# Patient Record
Sex: Female | Born: 1935 | Race: Black or African American | Hispanic: No | Marital: Single | Smoking: Never smoker
Health system: Southern US, Community
[De-identification: ages and names within clinical notes are randomized; demographics above are authoritative.]

## PROBLEM LIST (undated history)

## (undated) DIAGNOSIS — I10 Essential (primary) hypertension: Secondary | ICD-10-CM

## (undated) DIAGNOSIS — I639 Cerebral infarction, unspecified: Secondary | ICD-10-CM

## (undated) DIAGNOSIS — E785 Hyperlipidemia, unspecified: Secondary | ICD-10-CM

## (undated) DIAGNOSIS — G9341 Metabolic encephalopathy: Secondary | ICD-10-CM

## (undated) DIAGNOSIS — F0390 Unspecified dementia without behavioral disturbance: Secondary | ICD-10-CM

## (undated) DIAGNOSIS — E876 Hypokalemia: Secondary | ICD-10-CM

## (undated) HISTORY — PX: OTHER SURGICAL HISTORY: SHX169

---

## 2012-10-23 ENCOUNTER — Emergency Department (HOSPITAL_BASED_OUTPATIENT_CLINIC_OR_DEPARTMENT_OTHER): Payer: Medicare Other

## 2012-10-23 ENCOUNTER — Emergency Department (HOSPITAL_BASED_OUTPATIENT_CLINIC_OR_DEPARTMENT_OTHER)
Admission: EM | Admit: 2012-10-23 | Discharge: 2012-10-23 | Disposition: A | Discharge status: Other Acute Inpatient Hospital | Payer: Medicare Other | Attending: Emergency Medicine | Admitting: Emergency Medicine

## 2012-10-23 ENCOUNTER — Other Ambulatory Visit: Payer: Self-pay

## 2012-10-23 ENCOUNTER — Encounter (HOSPITAL_BASED_OUTPATIENT_CLINIC_OR_DEPARTMENT_OTHER): Payer: Self-pay | Admitting: *Deleted

## 2012-10-23 DIAGNOSIS — I10 Essential (primary) hypertension: Secondary | ICD-10-CM | POA: Insufficient documentation

## 2012-10-23 DIAGNOSIS — Z8673 Personal history of transient ischemic attack (TIA), and cerebral infarction without residual deficits: Secondary | ICD-10-CM | POA: Insufficient documentation

## 2012-10-23 DIAGNOSIS — Z79899 Other long term (current) drug therapy: Secondary | ICD-10-CM | POA: Insufficient documentation

## 2012-10-23 DIAGNOSIS — Z8639 Personal history of other endocrine, nutritional and metabolic disease: Secondary | ICD-10-CM | POA: Insufficient documentation

## 2012-10-23 DIAGNOSIS — R110 Nausea: Secondary | ICD-10-CM | POA: Insufficient documentation

## 2012-10-23 DIAGNOSIS — Z862 Personal history of diseases of the blood and blood-forming organs and certain disorders involving the immune mechanism: Secondary | ICD-10-CM | POA: Insufficient documentation

## 2012-10-23 DIAGNOSIS — Z7982 Long term (current) use of aspirin: Secondary | ICD-10-CM | POA: Insufficient documentation

## 2012-10-23 DIAGNOSIS — F0390 Unspecified dementia without behavioral disturbance: Secondary | ICD-10-CM | POA: Insufficient documentation

## 2012-10-23 DIAGNOSIS — E785 Hyperlipidemia, unspecified: Secondary | ICD-10-CM | POA: Insufficient documentation

## 2012-10-23 DIAGNOSIS — R5383 Other fatigue: Secondary | ICD-10-CM | POA: Insufficient documentation

## 2012-10-23 DIAGNOSIS — Z7902 Long term (current) use of antithrombotics/antiplatelets: Secondary | ICD-10-CM | POA: Insufficient documentation

## 2012-10-23 DIAGNOSIS — K819 Cholecystitis, unspecified: Secondary | ICD-10-CM

## 2012-10-23 DIAGNOSIS — R5381 Other malaise: Secondary | ICD-10-CM | POA: Insufficient documentation

## 2012-10-23 HISTORY — DX: Cerebral infarction, unspecified: I63.9

## 2012-10-23 HISTORY — DX: Hyperlipidemia, unspecified: E78.5

## 2012-10-23 HISTORY — DX: Hypokalemia: E87.6

## 2012-10-23 HISTORY — DX: Metabolic encephalopathy: G93.41

## 2012-10-23 HISTORY — DX: Unspecified dementia, unspecified severity, without behavioral disturbance, psychotic disturbance, mood disturbance, and anxiety: F03.90

## 2012-10-23 HISTORY — DX: Essential (primary) hypertension: I10

## 2012-10-23 LAB — URINALYSIS, ROUTINE W REFLEX MICROSCOPIC
Bilirubin Urine: NEGATIVE
Glucose, UA: NEGATIVE mg/dL
Hgb urine dipstick: NEGATIVE
Ketones, ur: NEGATIVE mg/dL
Nitrite: NEGATIVE
Protein, ur: NEGATIVE mg/dL
Specific Gravity, Urine: 1.019 (ref 1.005–1.030)
Urobilinogen, UA: 1 mg/dL (ref 0.0–1.0)
pH: 6 (ref 5.0–8.0)

## 2012-10-23 LAB — CBC WITH DIFFERENTIAL/PLATELET
Basophils Absolute: 0 10*3/uL (ref 0.0–0.1)
Basophils Relative: 0 % (ref 0–1)
HCT: 38.4 % (ref 36.0–46.0)
Hemoglobin: 12.9 g/dL (ref 12.0–15.0)
Lymphocytes Relative: 36 % (ref 12–46)
MCHC: 33.6 g/dL (ref 30.0–36.0)
Monocytes Relative: 10 % (ref 3–12)
Neutro Abs: 1.9 10*3/uL (ref 1.7–7.7)
Neutrophils Relative %: 52 % (ref 43–77)
WBC: 3.6 10*3/uL — ABNORMAL LOW (ref 4.0–10.5)

## 2012-10-23 LAB — HEPATIC FUNCTION PANEL
AST: 17 U/L (ref 0–37)
Albumin: 3.6 g/dL (ref 3.5–5.2)
Total Protein: 7.4 g/dL (ref 6.0–8.3)

## 2012-10-23 LAB — BASIC METABOLIC PANEL WITH GFR
BUN: 10 mg/dL (ref 6–23)
CO2: 24 meq/L (ref 19–32)
Calcium: 9.5 mg/dL (ref 8.4–10.5)
Chloride: 106 meq/L (ref 96–112)
Creatinine, Ser: 0.6 mg/dL (ref 0.50–1.10)
GFR calc Af Amer: >90 mL/min
GFR calc non Af Amer: 81 mL/min — ABNORMAL LOW
Glucose, Bld: 98 mg/dL (ref 70–99)
Potassium: 3.6 meq/L (ref 3.5–5.1)
Sodium: 141 meq/L (ref 135–145)

## 2012-10-23 LAB — LIPASE, BLOOD: Lipase: 36 U/L (ref 11–59)

## 2012-10-23 LAB — URINE MICROSCOPIC-ADD ON

## 2012-10-23 LAB — OCCULT BLOOD X 1 CARD TO LAB, STOOL: Fecal Occult Bld: NEGATIVE

## 2012-10-23 MED ORDER — IOHEXOL 300 MG/ML  SOLN
100.0000 mL | Freq: Once | INTRAMUSCULAR | Status: AC | PRN
  Administered 2012-10-23: 100 mL via INTRAVENOUS

## 2012-10-23 MED ORDER — MORPHINE SULFATE 2 MG/ML IJ SOLN
2.0000 mg | Freq: Once | INTRAMUSCULAR | Status: AC
  Administered 2012-10-23: 2 mg via INTRAVENOUS
  Filled 2012-10-23: qty 1

## 2012-10-23 MED ORDER — MORPHINE SULFATE 4 MG/ML IJ SOLN: 4.0000 mg | Freq: Once | INTRAMUSCULAR | Status: DC

## 2012-10-23 MED ORDER — PIPERACILLIN-TAZOBACTAM 3.375 G IVPB
3.3750 g | Freq: Once | INTRAVENOUS | Status: AC
  Administered 2012-10-23: 3.375 g via INTRAVENOUS
  Filled 2012-10-23: qty 50

## 2012-10-23 NOTE — ED Notes (Signed)
Assigned to bed 658 @ [facility] per nursing supervisor, Boneta Lucks, RN notified, [facility] called for transport.

## 2012-10-23 NOTE — ED Notes (Signed)
Pt put onto 2L of oxygen. Pt 02 were reading 91. 2L 02 applied and 02 sats came up to 97%

## 2012-10-23 NOTE — ED Notes (Signed)
Report called to [facility]. Report given to [facility]

## 2012-10-23 NOTE — ED Notes (Signed)
Pt finished with her liquid contrast drink at this time.

## 2012-10-23 NOTE — ED Notes (Signed)
I spoke with Sharon,nursing supervisor @ [facility] concerning a bed assignment for pt, I was told that I would receive a call back with bed assignment, Misty, RN notified.

## 2012-10-23 NOTE — ED Provider Notes (Signed)
79 year old female who presents to the assisted living with complaint of abdominal pain that began yesterday and worsened this a.m. She was seen and evaluated by Dr. Rennis Chris. She had a CT that raised question of an amount of the gallbladder an ultrasound that raises question of cholecystitis. She has abdominal pain but was able to eat breakfast. She has not had any by mouth since that time. She has not had any vomiting  US Abdomen Complete  10/23/2012  *RADIOLOGY REPORT*  Clinical Data:  Abdominal pain probable sludge or stones on prior CT.  ABDOMINAL ULTRASOUND COMPLETE  Comparison:  CT abdomen 10/23/2012  Findings:  Gallbladder:  Dependent echogenic tiny stones or sludge noted with the gallbladder wall thickening measuring 5 mm.  No sonographic Murphy's sign elicited.  No pericholecystic fluid.  Common Bile Duct:  Within normal limits in caliber.  Liver: Normal where visualized.  Not optimally visualized.  IVC:  Obscured by bowel gas.  Pancreas:  Head appears normal.  Body and tail not well visualized due to bowel gas.  Spleen:  Within normal limits in size and echotexture.  Right kidney:  Normal in size and parenchymal echogenicity.  No evidence of mass or hydronephrosis.  Left kidney:  9.8 cm. No hydronephrosis or focal mass.Not well visualized due to patient body habitus.  Abdominal Aorta:  Proximal and mid aorta are normal.  Distal aorta not well visualized.  IMPRESSION: Tiny gallstones versus sludge with gallbladder wall thickening, which may indicate acute cholecystitis.  Suboptimal visualization of multiple organs, better seen on prior dissimilar exam earlier today, due to patient body habitus and inability to take a deep breath.   Original Report Authenticated By: Christiana Pellant, M.D.    Ct Abdomen Pelvis W Contrast  10/23/2012  *RADIOLOGY REPORT*  Clinical Data: Abdominal pain of 1 day duration.  Previous hysterectomy.  CT ABDOMEN AND PELVIS WITH CONTRAST  Technique:  Multidetector CT imaging of  the abdomen and pelvis was performed following the standard protocol during bolus administration of intravenous contrast.  Contrast: OMNIPAQUE IOHEXOL 300 MG/ML  SOLN  Comparison: None.  Findings: There is mild scarring at the lung bases.  No pleural or pericardial fluid.  The patient has coronary artery calcification.  The liver has a normal appearance without focal lesions.  Layering material the gallbladder could represent stones or sludge.  No gross gallbladder wall thickening or pericholecystic fluid.  One could question a tensile  gallbladder sign that at least raises the possibility of cholecystitis.  The spleen is normal.  The pancreas is normal.  The adrenal glands are normal.  There is an 8 mm cyst at the upper pole of the right kidney.  There is a 2.8 cm cyst at the upper pole left kidney and a few other tiny sub-centimeter cysts.  The aorta and IVC are unremarkable.  No retroperitoneal mass or adenopathy.  No free intraperitoneal fluid or air.  There is been previous hysterectomy. No pelvic mass. Ordinary degenerative changes effect the spine.  No acute bowel pathology is evident.  Associated with the anterior lateral wall of the stomach, there is a soft tissue density mass measuring 3 cm in diameter with some peripheral calcification. This appears well circumscribed and round in shape.  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  Gastrointestinal stromal tumor is not excluded.  IMPRESSION: Sludge and/or gallstones in the gallbladder.  Question tensile gallbladder sign, at least raising the possibility of cholecystitis.  Consider ultrasound.  Benign-appearing renal cysts.  3 cm  mass along the anterior wall of the fundus of the stomach most consistent with leiomyoma.  Gastrointestinal stromal tumor not completely excluded.   Original Report Authenticated By: Paulina Fusi, M.D.     Filed Vitals:   10/23/12 1849  BP: 145/73  Pulse: 59  Temp:   Resp:     .vit Patient is afebrile  here remains hemodynamically stable. Physical exam awake and alert female with complaints of abdominal pain. Heart regular rate and rhythm Lungs clear to auscultation Abdomen soft with right upper quadrant tenderness positive Murphy's sign.  I discussed with patient and a cholecystitis and she states that she does not want to have surgery. I have ordered Zosyn 3.375 g IV. Patient is seen by Dr. Jasper Riling at [facility]. Her preference is [facility]. I have paged the hospitalist on call at [facility] [place]  I spoke with Dr. Delilah Shan.  He advises that they do not get surgical cases. I currently paged the surgeon.  Patient care discussed with Dr. Buzzy Han, on-call for general surgery. He feels that given that the patient does not want to have surgery at this time she can be admitted to the medicine service. I spoken with the hospitalist and she is accepted to a MedSurg bed. On these discussions have occurred with the family involved and they all voiced understanding.  Hilario Quarry, MD 10/23/12 [phone]

## 2012-10-23 NOTE — ED Notes (Signed)
Pt. Will be transported via [facility].  RN to call report to [facility] and give report.

## 2012-10-23 NOTE — ED Notes (Signed)
From [facility] with c.o pain in her abdomen per staff. Pt is alert on arrival. Moaning in pain with every breath. When asked about her pain she is unable to give details but rubs her entire chest and abdomen when asked the location of her pain.

## 2012-10-23 NOTE — ED Notes (Signed)
Per EDP do in and out cath on Pt. Due to status of Pt.   Pt. Unable to stand and unable to use BSC.

## 2012-10-23 NOTE — ED Provider Notes (Signed)
History     CSN: [id number]  Arrival date & time 10/23/12  1303   First MD Initiated Contact with Patient 10/23/12 1257      Chief Complaint  Patient presents with  . Abdominal Pain   Level V caveat, dementia history is obtained from patient and from records accompanying the patient and from Leodis Binet, Nature conservation officer. He complains of abdominal pain, constant Company by lethargy onset approximately 10 AM today. Patient also complained of nausea. She reports that the pain is diffuse. She denies nausea at present. Ms.Guidry is uncertain when patient last had a bowel movement. No treatment prior to coming here.. (Consider location/radiation/quality/duration/timing/severity/associated sxs/prior treatment) HPI  Past Medical History  Diagnosis Date  . Hypertension   . Stroke   . Hyperlipidemia   . Metabolic encephalopathy   . Hypopotassemia   . Dementia     Past Surgical History  Procedure Laterality Date  . Unknown      unknown    No family history on file.  History  Substance Use Topics  . Smoking status: Never Smoker   . Smokeless tobacco: Not on file  . Alcohol Use: No    OB History   Grav Para Term Preterm Abortions TAB SAB Ect Mult Living                  Review of Systems  Unable to perform ROS: Dementia  Constitutional: Positive for fatigue.  Gastrointestinal: Positive for abdominal pain.    Allergies  Review of patient's allergies indicates no known allergies.  Home Medications   Current Outpatient Rx  Name  Route  Sig  Dispense  Refill  . aspirin 81 MG tablet   Oral   Take 81 mg by mouth daily.         Marland Kitchen atorvastatin (LIPITOR) 40 MG tablet   Oral   Take 40 mg by mouth daily.         . clopidogrel (PLAVIX) 75 MG tablet   Oral   Take 75 mg by mouth daily.         . famotidine (PEPCID) 20 MG tablet   Oral   Take 20 mg by mouth 2 (two) times daily.         . famotidine (PEPCID) 20 MG tablet   Oral   Take 20 mg by mouth 2  (two) times daily.         . hydrALAZINE (APRESOLINE) 25 MG tablet   Oral   Take 25 mg by mouth 3 (three) times daily.         . metoprolol (LOPRESSOR) 50 MG tablet   Oral   Take 50 mg by mouth 2 (two) times daily.         . Multiple Vitamin (MULTIVITAMIN) capsule   Oral   Take 1 capsule by mouth daily.           BP 127/61  Pulse 63  Temp(Src) 98.1 F (36.7 C) (Oral)  Resp 20  SpO2 97%  Physical Exam  Nursing note and vitals reviewed. Constitutional: She appears well-developed and well-nourished.  HENT:  Head: Normocephalic and atraumatic.  Eyes: Conjunctivae are normal. Pupils are equal, round, and reactive to light.  Neck: Neck supple. No tracheal deviation present. No thyromegaly present.  Cardiovascular: Normal rate and regular rhythm.   No murmur heard. Pulmonary/Chest: Effort normal and breath sounds normal.  Abdominal: Soft. Bowel sounds are normal. She exhibits no distension. There is tenderness.  Obese tender at left  lower quadrant  Genitourinary:  Rectal normal tone, no gross blood brown stool  Musculoskeletal: Normal range of motion. She exhibits no edema and no tenderness.  Neurological: She is alert. She exhibits normal muscle tone. Coordination normal.  Skin: Skin is warm and dry. No rash noted.  Psychiatric: She has a normal mood and affect.    ED Course  Procedures (including critical care time)  Labs Reviewed  CBC WITH DIFFERENTIAL - Abnormal; Notable for the following:    WBC 3.6 (*)    RDW 16.1 (*)    All other components within normal limits  OCCULT BLOOD X 1 CARD TO LAB, STOOL  URINALYSIS, ROUTINE W REFLEX MICROSCOPIC  BASIC METABOLIC PANEL  LIPASE, BLOOD  HEPATIC FUNCTION PANEL   No results found.   No diagnosis found.   Date: 10/23/2012  Rate: 65  Rhythm: normal sinus rhythm  QRS Axis: normal  Intervals: normal  ST/T Wave abnormalities: normal  Conduction Disutrbances: none  Narrative Interpretation:  unremarkable  No EKG for comparison Results for orders placed during the hospital encounter of 10/23/12  URINALYSIS, ROUTINE W REFLEX MICROSCOPIC      Result Value Range   Color, Urine YELLOW  YELLOW   APPearance CLEAR  CLEAR   Specific Gravity, Urine 1.019  1.005 - 1.030   pH 6.0  5.0 - 8.0   Glucose, UA NEGATIVE  NEGATIVE mg/dL   Hgb urine dipstick NEGATIVE  NEGATIVE   Bilirubin Urine NEGATIVE  NEGATIVE   Ketones, ur NEGATIVE  NEGATIVE mg/dL   Protein, ur NEGATIVE  NEGATIVE mg/dL   Urobilinogen, UA 1.0  0.0 - 1.0 mg/dL   Nitrite NEGATIVE  NEGATIVE   Leukocytes, UA TRACE (*) NEGATIVE  CBC WITH DIFFERENTIAL      Result Value Range   WBC 3.6 (*) 4.0 - 10.5 K/uL   RBC 4.61  3.87 - 5.11 MIL/uL   Hemoglobin 12.9  12.0 - 15.0 g/dL   HCT 40.9  81.1 - 91.4 %   MCV 83.3  78.0 - 100.0 fL   MCH 28.0  26.0 - 34.0 pg   MCHC 33.6  30.0 - 36.0 g/dL   RDW 78.2 (*) 95.6 - 21.3 %   Platelets 229  150 - 400 K/uL   Neutrophils Relative 52  43 - 77 %   Neutro Abs 1.9  1.7 - 7.7 K/uL   Lymphocytes Relative 36  12 - 46 %   Lymphs Abs 1.3  0.7 - 4.0 K/uL   Monocytes Relative 10  3 - 12 %   Monocytes Absolute 0.4  0.1 - 1.0 K/uL   Eosinophils Relative 2  0 - 5 %   Eosinophils Absolute 0.1  0.0 - 0.7 K/uL   Basophils Relative 0  0 - 1 %   Basophils Absolute 0.0  0.0 - 0.1 K/uL  BASIC METABOLIC PANEL      Result Value Range   Sodium 141  135 - 145 mEq/L   Potassium 3.6  3.5 - 5.1 mEq/L   Chloride 106  96 - 112 mEq/L   CO2 24  19 - 32 mEq/L   Glucose, Bld 98  70 - 99 mg/dL   BUN 10  6 - 23 mg/dL   Creatinine, Ser 0.86  0.50 - 1.10 mg/dL   Calcium 9.5  8.4 - 57.8 mg/dL   GFR calc non Af Amer 81 (*) >90 mL/min   GFR calc Af Amer >90  >90 mL/min  OCCULT BLOOD X 1 CARD TO  LAB, STOOL      Result Value Range   Fecal Occult Bld NEGATIVE  NEGATIVE  LIPASE, BLOOD      Result Value Range   Lipase 36  11 - 59 U/L  HEPATIC FUNCTION PANEL      Result Value Range   Total Protein 7.4  6.0 - 8.3 g/dL    Albumin 3.6  3.5 - 5.2 g/dL   AST 17  0 - 37 U/L   ALT 17  0 - 35 U/L   Alkaline Phosphatase 154 (*) 39 - 117 U/L   Total Bilirubin 0.5  0.3 - 1.2 mg/dL   Bilirubin, Direct 0.1  0.0 - 0.3 mg/dL   Indirect Bilirubin 0.4  0.3 - 0.9 mg/dL  URINE MICROSCOPIC-ADD ON      Result Value Range   Squamous Epithelial / LPF RARE  RARE   WBC, UA 0-2  <3 WBC/hpf   RBC / HPF 0-2  <3 RBC/hpf   Bacteria, UA RARE  RARE   Urine-Other MUCOUS PRESENT     Ct Abdomen Pelvis W Contrast  10/23/2012  *RADIOLOGY REPORT*  Clinical Data: Abdominal pain of 1 day duration.  Previous hysterectomy.  CT ABDOMEN AND PELVIS WITH CONTRAST  Technique:  Multidetector CT imaging of the abdomen and pelvis was performed following the standard protocol during bolus administration of intravenous contrast.  Contrast: OMNIPAQUE IOHEXOL 300 MG/ML  SOLN  Comparison: None.  Findings: There is mild scarring at the lung bases.  No pleural or pericardial fluid.  The patient has coronary artery calcification.  The liver has a normal appearance without focal lesions.  Layering material the gallbladder could represent stones or sludge.  No gross gallbladder wall thickening or pericholecystic fluid.  One could question a tensile  gallbladder sign that at least raises the possibility of cholecystitis.  The spleen is normal.  The pancreas is normal.  The adrenal glands are normal.  There is an 8 mm cyst at the upper pole of the right kidney.  There is a 2.8 cm cyst at the upper pole left kidney and a few other tiny sub-centimeter cysts.  The aorta and IVC are unremarkable.  No retroperitoneal mass or adenopathy.  No free intraperitoneal fluid or air.  There is been previous hysterectomy. No pelvic mass. Ordinary degenerative changes effect the spine.  No acute bowel pathology is evident.  Associated with the anterior lateral wall of the stomach, there is a soft tissue density mass measuring 3 cm in diameter with some peripheral calcification. This  appears well circumscribed and round in shape.  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  Gastrointestinal stromal tumor is not excluded.  IMPRESSION: Sludge and/or gallstones in the gallbladder.  Question tensile gallbladder sign, at least raising the possibility of cholecystitis.  Consider ultrasound.  Benign-appearing renal cysts.  3 cm mass along the anterior wall of the fundus of the stomach most consistent with leiomyoma.  Gastrointestinal stromal tumor not completely excluded.   Original Report Authenticated By: Paulina Fusi, M.D.       MDM  Pt signed out to Dr. Rosalia Hammers 410 pm  Dx Abdominal pain        Doug Sou, MD 10/23/12 1626

## 2016-02-01 ENCOUNTER — Emergency Department (HOSPITAL_COMMUNITY)
Admission: EM | Admit: 2016-02-01 | Discharge: 2016-02-01 | Disposition: A | Discharge status: Home / Self Care | Payer: Medicare Other | Attending: Emergency Medicine | Admitting: Emergency Medicine

## 2016-02-01 ENCOUNTER — Emergency Department (HOSPITAL_COMMUNITY): Payer: Medicare Other

## 2016-02-01 ENCOUNTER — Encounter (HOSPITAL_COMMUNITY): Payer: Self-pay | Admitting: Emergency Medicine

## 2016-02-01 DIAGNOSIS — T675XXA Heat exhaustion, unspecified, initial encounter: Secondary | ICD-10-CM | POA: Insufficient documentation

## 2016-02-01 DIAGNOSIS — Z8673 Personal history of transient ischemic attack (TIA), and cerebral infarction without residual deficits: Secondary | ICD-10-CM | POA: Insufficient documentation

## 2016-02-01 DIAGNOSIS — R5383 Other fatigue: Secondary | ICD-10-CM | POA: Diagnosis present

## 2016-02-01 DIAGNOSIS — R51 Headache: Secondary | ICD-10-CM | POA: Diagnosis not present

## 2016-02-01 DIAGNOSIS — R531 Weakness: Secondary | ICD-10-CM

## 2016-02-01 DIAGNOSIS — Z7984 Long term (current) use of oral hypoglycemic drugs: Secondary | ICD-10-CM | POA: Insufficient documentation

## 2016-02-01 DIAGNOSIS — Z7982 Long term (current) use of aspirin: Secondary | ICD-10-CM | POA: Diagnosis not present

## 2016-02-01 DIAGNOSIS — I10 Essential (primary) hypertension: Secondary | ICD-10-CM | POA: Diagnosis not present

## 2016-02-01 DIAGNOSIS — Z7901 Long term (current) use of anticoagulants: Secondary | ICD-10-CM | POA: Diagnosis not present

## 2016-02-01 DIAGNOSIS — Z79899 Other long term (current) drug therapy: Secondary | ICD-10-CM | POA: Diagnosis not present

## 2016-02-01 LAB — BASIC METABOLIC PANEL
Anion gap: 9 (ref 5–15)
BUN: 11 mg/dL (ref 6–20)
CALCIUM: 9.2 mg/dL (ref 8.9–10.3)
CO2: 22 mmol/L (ref 22–32)
CREATININE: 0.88 mg/dL (ref 0.44–1.00)
Chloride: 109 mmol/L (ref 101–111)
GFR calc Af Amer: >60 mL/min (ref >60)
GFR, EST NON AFRICAN AMERICAN: 57 mL/min — AB (ref >60)
GLUCOSE: 118 mg/dL — AB (ref 65–99)
Potassium: 3.5 mmol/L (ref 3.5–5.1)
Sodium: 140 mmol/L (ref 135–145)

## 2016-02-01 LAB — URINALYSIS, ROUTINE W REFLEX MICROSCOPIC
BILIRUBIN URINE: NEGATIVE
Glucose, UA: NEGATIVE mg/dL
HGB URINE DIPSTICK: NEGATIVE
KETONES UR: NEGATIVE mg/dL
NITRITE: NEGATIVE
PROTEIN: NEGATIVE mg/dL
SPECIFIC GRAVITY, URINE: 1.024 (ref 1.005–1.030)
pH: 5 (ref 5.0–8.0)

## 2016-02-01 LAB — CBC
HCT: 40.7 % (ref 36.0–46.0)
Hemoglobin: 13 g/dL (ref 12.0–15.0)
MCH: 27.1 pg (ref 26.0–34.0)
MCHC: 31.9 g/dL (ref 30.0–36.0)
MCV: 84.8 fL (ref 78.0–100.0)
PLATELETS: 248 10*3/uL (ref 150–400)
RBC: 4.8 MIL/uL (ref 3.87–5.11)
RDW: 16.7 % — AB (ref 11.5–15.5)
WBC: 5.2 10*3/uL (ref 4.0–10.5)

## 2016-02-01 LAB — URINE MICROSCOPIC-ADD ON: RBC / HPF: NONE SEEN RBC/hpf (ref 0–5)

## 2016-02-01 LAB — I-STAT TROPONIN, ED: TROPONIN I, POC: 0.01 ng/mL (ref 0.00–0.08)

## 2016-02-01 LAB — CBG MONITORING, ED: GLUCOSE-CAPILLARY: 123 mg/dL — AB (ref 65–99)

## 2016-02-01 MED ORDER — SODIUM CHLORIDE 0.9 % IV BOLUS (SEPSIS)
1000.0000 mL | Freq: Once | INTRAVENOUS | Status: AC
  Administered 2016-02-01: 1000 mL via INTRAVENOUS

## 2016-02-01 NOTE — ED Triage Notes (Signed)
Pt brought to ED by [facility] from [facility] for c/o generalized weakness, staff state pt is not at her base line, last seen well at 11:30 am when family visited her, pt was taking out by staff for around 1.5 hr on the heat and pt starting feeling weak and no answering question appropriate, pt AO x 4, NAD noticed, denies any pain or discomfort at this time.

## 2016-02-01 NOTE — ED Notes (Signed)
Pt back from CT and XR.

## 2016-02-01 NOTE — Discharge Instructions (Signed)
Return without fail for worsening symptoms, including fever, confusion, inability to walk, intractable vomiting, or any other symptoms concerning to you.

## 2016-02-01 NOTE — ED Notes (Signed)
CBG is 128. 

## 2016-02-01 NOTE — ED Notes (Signed)
EDP at the bedside.  ?

## 2016-02-01 NOTE — ED Notes (Signed)
Patient transported to CT 

## 2016-02-01 NOTE — ED Notes (Addendum)
Pt ambulated on the hallway using a walker with slow steady gait, no SOB or dizziness noticed.

## 2016-02-01 NOTE — ED Provider Notes (Signed)
[facility]-EMERGENCY DEPT Provider Note   CSN: [id number] Arrival date & time: 02/01/16  7829  First Provider Contact:  First MD Initiated Contact with Patient 02/01/16 1930        History   Chief Complaint Chief Complaint  Patient presents with  . Fatigue    HPI Leslie Sherman is a 82 y.o. female.  HPI 82 year old female with history of prior CVA with mild residual left-sided headache. This, vascular dementia, hypertension hyperlipidemia. She presents from [facility] for fatigue and weakness. States she has been in usual state of health, and per family, normal this morning. Was outside in the heat for 2 hours today, when staff began to noticed that she looked unwell, was less talkative, and was unable to walk. Family members called, and when they arrived, she appeared fatigued and confusion. No fever, chills, fall, n/v/d, chest pain, sob, cough, syncope or near syncope, focal numbness or weakness.  Past Medical History:  Diagnosis Date  . Dementia   . Hyperlipidemia   . Hypertension   . Hypopotassemia   . Metabolic encephalopathy   . Stroke [facility])     There are no active problems to display for this patient.   Past Surgical History:  Procedure Laterality Date  . unknown     unknown    OB History    No data available       Home Medications    Prior to Admission medications   Medication Sig Start Date End Date Taking? Authorizing Provider  acetaminophen (TYLENOL) 325 MG tablet Take 325 mg by mouth every 8 (eight) hours as needed for headache.   Yes Historical Provider, MD  acetaminophen (TYLENOL) 500 MG tablet Take 500 mg by mouth every 8 (eight) hours as needed for mild pain.   Yes Historical Provider, MD  aspirin 81 MG chewable tablet Chew 81 mg by mouth daily.   Yes Historical Provider, MD  atorvastatin (LIPITOR) 40 MG tablet Take 40 mg by mouth at bedtime.    Yes Historical Provider, MD  clopidogrel (PLAVIX) 75 MG tablet Take 75 mg by mouth daily.   Yes Historical  Provider, MD  docusate sodium (COLACE) 100 MG capsule Take 100 mg by mouth at bedtime.   Yes Historical Provider, MD  famotidine (PEPCID) 20 MG tablet Take 20 mg by mouth every morning.    Yes Historical Provider, MD  hydrALAZINE (APRESOLINE) 25 MG tablet Take 25 mg by mouth 3 (three) times daily.   Yes Historical Provider, MD  metFORMIN (GLUCOPHAGE) 500 MG tablet Take 500 mg by mouth daily with breakfast.   Yes Historical Provider, MD  metoprolol (LOPRESSOR) 50 MG tablet Take 50 mg by mouth 2 (two) times daily.   Yes Historical Provider, MD  Multiple Vitamin (MULTIVITAMIN) capsule Take 1 capsule by mouth daily.   Yes Historical Provider, MD  oxyCODONE-acetaminophen (PERCOCET/ROXICET) 5-325 MG tablet Take by mouth every 6 (six) hours as needed for severe pain.   Yes Historical Provider, MD    Family History History reviewed. No pertinent family history.  Social History Social History  Substance Use Topics  . Smoking status: Never Smoker  . Smokeless tobacco: Never Used  . Alcohol use No     Allergies   Review of patient's allergies indicates no known allergies.   Review of Systems Review of Systems 10/14 systems reviewed and are negative other than those stated in the HPI   Physical Exam Updated Vital Signs BP 144/64   Pulse 76   Temp 98.3 F (  36.8 C) (Oral)   Resp 17   Ht 5\' 5"  (1.651 m)   Wt 200 lb (90.7 kg)   SpO2 96%   BMI 33.28 kg/m   Physical Exam Physical Exam  Nursing note and vitals reviewed. Constitutional: Appears worn out, non-toxic, and in no acute distress Head: Normocephalic and atraumatic.  Mouth/Throat: Oropharynx is clear and dry mucous membranes.  Neck: Normal range of motion. Neck supple.  Cardiovascular: Normal rate and regular rhythm.   Pulmonary/Chest: Effort normal and breath sounds normal.  Abdominal: Soft. There is no tenderness. There is no rebound and no guarding.  Musculoskeletal: Normal range of motion.  Skin: Skin is warm and dry.    Psychiatric: Cooperative Neurological:  Alert, oriented to person, place, time, and situation. Memory grossly in tact. Fluent speech. No dysarthria or aphasia.  Cranial nerves: Pupils are symmetric, and reactive to light. EOMI without nystagmus. No gaze deviation. Facial muscles symmetric with activation. Sensation to light touch over face in tact bilaterally. Hearing grossly in tact. Palate elevates symmetrically. Head turn and shoulder shrug are intact. Tongue midline.  Reflexes defered.  Muscle bulk and tone normal. No pronator drift. Moves all extremities symmetrically. Sensation to light touch is in tact throughout in bilateral upper and lower extremities. Coordination reveals no dysmetria with finger to nose. Gait is narrow-based and steady. Non-ataxic with walker.    ED Treatments / Results  Labs (all labs ordered are listed, but only abnormal results are displayed) Labs Reviewed  BASIC METABOLIC PANEL - Abnormal; Notable for the following:       Result Value   Glucose, Bld 118 (*)    GFR calc non Af Amer 57 (*)    All other components within normal limits  CBC - Abnormal; Notable for the following:    RDW 16.7 (*)    All other components within normal limits  URINALYSIS, ROUTINE W REFLEX MICROSCOPIC (NOT AT [facility]) - Abnormal; Notable for the following:    Leukocytes, UA TRACE (*)    All other components within normal limits  URINE MICROSCOPIC-ADD ON - Abnormal; Notable for the following:    Squamous Epithelial / LPF 0-5 (*)    Bacteria, UA FEW (*)    All other components within normal limits  CBG MONITORING, ED - Abnormal; Notable for the following:    Glucose-Capillary 123 (*)    All other components within normal limits  CBG MONITORING, ED  I-STAT TROPOININ, ED    EKG  EKG Interpretation None       Radiology Dg Chest 2 View  Result Date: 02/01/2016 CLINICAL DATA:  Initial evaluation for acute confusion and generalized weakness. EXAM: CHEST  2 VIEW COMPARISON:   Prior radiograph from 10/09/2014. FINDINGS: Mild cardiomegaly is stable. Mediastinal silhouette within normal limits. Atheromatous plaque within the aortic arch. Lungs are mildly hypoinflated. Associated bibasilar linear opacities most compatible with atelectasis. No pulmonary edema. No consolidative airspace disease. No pleural effusion. No pneumothorax. No acute osseous abnormality. Scattered multilevel degenerative spondylolysis noted within the visualized spine. IMPRESSION: 1. Shallow lung inflation with mild bibasilar atelectasis. No other active cardiopulmonary disease. 2. Stable cardiomegaly without pulmonary edema. 3. The Aortic atherosclerosis. Electronically Signed   By: Rise Mu M.D.   On: 02/01/2016 21:04   Ct Head Wo Contrast  Result Date: 02/01/2016 CLINICAL DATA:  82 year old female with acute onset weakness and confusion. Heat exposure today wall walking outside. Initial encounter. EXAM: CT HEAD WITHOUT CONTRAST TECHNIQUE: Contiguous axial images were obtained from the base  of the skull through the vertex without intravenous contrast. COMPARISON:  Report of noncontrast head CT [facility] 05/31/2012 (no images available). FINDINGS: Minimal paranasal sinus mucosal thickening. Mastoids and tympanic cavities are clear. No acute osseous abnormality identified. No acute orbit or scalp soft tissue finding. Calcified atherosclerosis at the skull base. Lacunar infarct of the left basal ganglia, which was described in 2013. No cortically based acute infarct identified. No acute intracranial hemorrhage identified. No midline shift, mass effect, or evidence of intracranial mass lesion. No ventriculomegaly. Mild for age patchy bilateral white matter hypodensity. No suspicious intracranial vascular hyperdensity. IMPRESSION: No acute intracranial abnormality. Chronic left basal ganglia lacunar infarct. Electronically Signed   By: Odessa Fleming M.D.   On: 02/01/2016 20:52     Procedures Procedures (including critical care time)  Medications Ordered in ED Medications  sodium chloride 0.9 % bolus 1,000 mL (0 mLs Intravenous Stopped 02/01/16 2105)     Initial Impression / Assessment and Plan / ED Course  I have reviewed the triage vital signs and the nursing notes.  Pertinent labs & imaging results that were available during my care of the patient were reviewed by me and considered in my medical decision making (see chart for details).  Clinical Course  Value Comment By Time  Bilirubin Urine: NEGATIVE (Reviewed) Lavera Guise, MD 08/47 5963    82 year old female with history of prior CVA, vascular dementia, hypertension and hyperlipidemia who presents with fatigue and weakness after a heat exposure. He is nontoxic and in no acute distress but does appear worn out on presentation. She has normal vital signs. She has no focal neurological deficits. Does appear mildly dry. CT head shows old ischemic changes but no acute symptoms. No focal deficits and I'm not concerned for an acute CVA. Basic blood work does not show severe metabolic or electrolyte derangements. No evidence of infection on chest x-ray, blood work, or urinalysis. She received a liter of IV fluids, and states feeling improved. She is subsequently able to ambulate with a walker at her baseline, and is at her mental status baseline. I feel that her symptoms may be fatigue from heat exposure, and given improved symptoms I do not feel she requires admission.   The patient appears reasonably screened and/or stabilized for discharge and I doubt any other medical condition or other [facility] requiring further screening, evaluation, or treatment in the ED at this time prior to discharge.  Strict return and follow-up instructions reviewed with family and patient. they expressed understanding of all discharge instructions and felt comfortable with the plan of care.   Final Clinical Impressions(s) / ED Diagnoses    Final diagnoses:  Heat exhaustion, initial encounter  Generalized weakness    New Prescriptions Discharge Medication List as of 02/01/2016 11:10 PM       Lavera Guise, MD 02/01/16 [phone]

## 2021-04-08 ENCOUNTER — Other Ambulatory Visit: Payer: Self-pay

## 2021-04-08 ENCOUNTER — Encounter (HOSPITAL_COMMUNITY): Payer: Self-pay | Admitting: Family Medicine

## 2021-04-08 ENCOUNTER — Emergency Department (HOSPITAL_BASED_OUTPATIENT_CLINIC_OR_DEPARTMENT_OTHER): Payer: Medicare Other

## 2021-04-08 ENCOUNTER — Inpatient Hospital Stay (HOSPITAL_BASED_OUTPATIENT_CLINIC_OR_DEPARTMENT_OTHER)
Admission: EM | Admit: 2021-04-08 | Discharge: 2021-04-14 | DRG: 689 | Disposition: A | Discharge status: Skilled Nursing Facility | Payer: Medicare Other | Attending: Internal Medicine | Admitting: Internal Medicine

## 2021-04-08 DIAGNOSIS — Z66 Do not resuscitate: Secondary | ICD-10-CM | POA: Diagnosis present

## 2021-04-08 DIAGNOSIS — B9620 Unspecified Escherichia coli [E. coli] as the cause of diseases classified elsewhere: Secondary | ICD-10-CM | POA: Diagnosis present

## 2021-04-08 DIAGNOSIS — G9341 Metabolic encephalopathy: Secondary | ICD-10-CM | POA: Diagnosis not present

## 2021-04-08 DIAGNOSIS — F0390 Unspecified dementia without behavioral disturbance: Secondary | ICD-10-CM | POA: Diagnosis not present

## 2021-04-08 DIAGNOSIS — Z7982 Long term (current) use of aspirin: Secondary | ICD-10-CM | POA: Diagnosis not present

## 2021-04-08 DIAGNOSIS — R4182 Altered mental status, unspecified: Secondary | ICD-10-CM

## 2021-04-08 DIAGNOSIS — E876 Hypokalemia: Secondary | ICD-10-CM | POA: Diagnosis present

## 2021-04-08 DIAGNOSIS — Z79899 Other long term (current) drug therapy: Secondary | ICD-10-CM

## 2021-04-08 DIAGNOSIS — G9340 Encephalopathy, unspecified: Secondary | ICD-10-CM | POA: Diagnosis not present

## 2021-04-08 DIAGNOSIS — Z91128 Patient's intentional underdosing of medication regimen for other reason: Secondary | ICD-10-CM

## 2021-04-08 DIAGNOSIS — N390 Urinary tract infection, site not specified: Principal | ICD-10-CM | POA: Diagnosis present

## 2021-04-08 DIAGNOSIS — Z8673 Personal history of transient ischemic attack (TIA), and cerebral infarction without residual deficits: Secondary | ICD-10-CM

## 2021-04-08 DIAGNOSIS — E785 Hyperlipidemia, unspecified: Secondary | ICD-10-CM | POA: Diagnosis not present

## 2021-04-08 DIAGNOSIS — Z8616 Personal history of COVID-19: Secondary | ICD-10-CM | POA: Diagnosis not present

## 2021-04-08 DIAGNOSIS — Z6831 Body mass index (BMI) 31.0-31.9, adult: Secondary | ICD-10-CM

## 2021-04-08 DIAGNOSIS — E860 Dehydration: Secondary | ICD-10-CM | POA: Diagnosis present

## 2021-04-08 DIAGNOSIS — R630 Anorexia: Secondary | ICD-10-CM | POA: Diagnosis present

## 2021-04-08 DIAGNOSIS — U071 COVID-19: Secondary | ICD-10-CM

## 2021-04-08 DIAGNOSIS — I10 Essential (primary) hypertension: Secondary | ICD-10-CM | POA: Diagnosis not present

## 2021-04-08 DIAGNOSIS — N3000 Acute cystitis without hematuria: Secondary | ICD-10-CM | POA: Diagnosis not present

## 2021-04-08 DIAGNOSIS — E119 Type 2 diabetes mellitus without complications: Secondary | ICD-10-CM | POA: Diagnosis not present

## 2021-04-08 DIAGNOSIS — Z7984 Long term (current) use of oral hypoglycemic drugs: Secondary | ICD-10-CM | POA: Diagnosis not present

## 2021-04-08 DIAGNOSIS — Z7902 Long term (current) use of antithrombotics/antiplatelets: Secondary | ICD-10-CM

## 2021-04-08 DIAGNOSIS — E669 Obesity, unspecified: Secondary | ICD-10-CM | POA: Diagnosis present

## 2021-04-08 LAB — URINALYSIS, MICROSCOPIC (REFLEX)

## 2021-04-08 LAB — COMPREHENSIVE METABOLIC PANEL
ALT: 25 U/L (ref 0–44)
AST: 45 U/L — ABNORMAL HIGH (ref 15–41)
Albumin: 4 g/dL (ref 3.5–5.0)
Alkaline Phosphatase: 135 U/L — ABNORMAL HIGH (ref 38–126)
Anion gap: 9 (ref 5–15)
BUN: 11 mg/dL (ref 8–23)
CO2: 26 mmol/L (ref 22–32)
Calcium: 9.4 mg/dL (ref 8.9–10.3)
Chloride: 106 mmol/L (ref 98–111)
Creatinine, Ser: 0.83 mg/dL (ref 0.44–1.00)
GFR, Estimated: >60 mL/min (ref >60)
Glucose, Bld: 134 mg/dL — ABNORMAL HIGH (ref 70–99)
Potassium: 3.6 mmol/L (ref 3.5–5.1)
Sodium: 141 mmol/L (ref 135–145)
Total Bilirubin: 0.7 mg/dL (ref 0.3–1.2)
Total Protein: 8.2 g/dL — ABNORMAL HIGH (ref 6.5–8.1)

## 2021-04-08 LAB — URINALYSIS, ROUTINE W REFLEX MICROSCOPIC
Bilirubin Urine: NEGATIVE
Glucose, UA: NEGATIVE mg/dL
Hgb urine dipstick: NEGATIVE
Ketones, ur: NEGATIVE mg/dL
Nitrite: POSITIVE — AB
Protein, ur: 30 mg/dL — AB
Specific Gravity, Urine: 1.03 (ref 1.005–1.030)
pH: 5.5 (ref 5.0–8.0)

## 2021-04-08 LAB — CBC WITH DIFFERENTIAL/PLATELET
Abs Immature Granulocytes: 0.01 10*3/uL (ref 0.00–0.07)
Basophils Absolute: 0 10*3/uL (ref 0.0–0.1)
Basophils Relative: 0 %
Eosinophils Absolute: 0 10*3/uL (ref 0.0–0.5)
Eosinophils Relative: 0 %
HCT: 39.9 % (ref 36.0–46.0)
Hemoglobin: 12.8 g/dL (ref 12.0–15.0)
Immature Granulocytes: 0 %
Lymphocytes Relative: 25 %
Lymphs Abs: 1.5 10*3/uL (ref 0.7–4.0)
MCH: 27.5 pg (ref 26.0–34.0)
MCHC: 32.1 g/dL (ref 30.0–36.0)
MCV: 85.8 fL (ref 80.0–100.0)
Monocytes Absolute: 0.5 10*3/uL (ref 0.1–1.0)
Monocytes Relative: 8 %
Neutro Abs: 3.8 10*3/uL (ref 1.7–7.7)
Neutrophils Relative %: 67 %
Platelets: 294 10*3/uL (ref 150–400)
RBC: 4.65 MIL/uL (ref 3.87–5.11)
RDW: 17.1 % — ABNORMAL HIGH (ref 11.5–15.5)
WBC: 5.7 10*3/uL (ref 4.0–10.5)
nRBC: 0 % (ref 0.0–0.2)

## 2021-04-08 LAB — LIPASE, BLOOD: Lipase: 31 U/L (ref 11–51)

## 2021-04-08 LAB — RESP PANEL BY RT-PCR (FLU A&B, COVID) ARPGX2
Influenza A by PCR: NEGATIVE
Influenza B by PCR: NEGATIVE
SARS Coronavirus 2 by RT PCR: POSITIVE — AB

## 2021-04-08 LAB — CBG MONITORING, ED: Glucose-Capillary: 128 mg/dL — ABNORMAL HIGH (ref 70–99)

## 2021-04-08 LAB — LACTIC ACID, PLASMA
Lactic Acid, Venous: 2.8 mmol/L (ref 0.5–1.9)
Lactic Acid, Venous: 1.9 mmol/L (ref 0.5–1.9)

## 2021-04-08 MED ORDER — CLOPIDOGREL BISULFATE 75 MG PO TABS
75.0000 mg | ORAL_TABLET | Freq: Every day | ORAL | Status: DC
  Administered 2021-04-09 – 2021-04-13 (×5): 75 mg via ORAL
  Filled 2021-04-08 (×5): qty 1

## 2021-04-08 MED ORDER — NIRMATRELVIR/RITONAVIR (PAXLOVID) TABLET (RENAL DOSING)
2.0000 | ORAL_TABLET | Freq: Two times a day (BID) | ORAL | Status: DC
  Filled 2021-04-08: qty 20

## 2021-04-08 MED ORDER — CEFTRIAXONE SODIUM 1 G IJ SOLR
1.0000 g | INTRAMUSCULAR | Status: DC
  Administered 2021-04-09 – 2021-04-12 (×4): 1 g via INTRAVENOUS
  Filled 2021-04-08 (×4): qty 10

## 2021-04-08 MED ORDER — INSULIN ASPART 100 UNIT/ML IJ SOLN
0.0000 [IU] | Freq: Every day | INTRAMUSCULAR | Status: DC
Start: 2021-04-09 — End: 2021-04-15

## 2021-04-08 MED ORDER — ATORVASTATIN CALCIUM 40 MG PO TABS
40.0000 mg | ORAL_TABLET | Freq: Every day | ORAL | Status: DC
  Administered 2021-04-09 – 2021-04-12 (×4): 40 mg via ORAL
  Filled 2021-04-08 (×5): qty 1

## 2021-04-08 MED ORDER — INSULIN ASPART 100 UNIT/ML IJ SOLN: 0.0000 [IU] | Freq: Three times a day (TID) | INTRAMUSCULAR | Status: DC

## 2021-04-08 MED ORDER — NIRMATRELVIR/RITONAVIR (PAXLOVID)TABLET: 3.0000 | ORAL_TABLET | Freq: Two times a day (BID) | ORAL | Status: DC

## 2021-04-08 MED ORDER — ONDANSETRON HCL 4 MG/2ML IJ SOLN: 4.0000 mg | Freq: Four times a day (QID) | INTRAMUSCULAR | Status: DC | PRN

## 2021-04-08 MED ORDER — METOPROLOL TARTRATE 50 MG PO TABS
50.0000 mg | ORAL_TABLET | Freq: Two times a day (BID) | ORAL | Status: DC
  Administered 2021-04-09 – 2021-04-13 (×9): 50 mg via ORAL
  Filled 2021-04-08 (×10): qty 1

## 2021-04-08 MED ORDER — ACETAMINOPHEN 325 MG PO TABS
650.0000 mg | ORAL_TABLET | Freq: Four times a day (QID) | ORAL | Status: DC | PRN
  Administered 2021-04-10 – 2021-04-11 (×2): 650 mg via ORAL
  Filled 2021-04-08 (×2): qty 2

## 2021-04-08 MED ORDER — HYDRALAZINE HCL 25 MG PO TABS
25.0000 mg | ORAL_TABLET | Freq: Three times a day (TID) | ORAL | Status: DC
  Administered 2021-04-09 – 2021-04-13 (×12): 25 mg via ORAL
  Filled 2021-04-08 (×13): qty 1

## 2021-04-08 MED ORDER — SODIUM CHLORIDE 0.9 % IV SOLN
2.0000 g | Freq: Once | INTRAVENOUS | Status: AC
  Administered 2021-04-08: 2 g via INTRAVENOUS
  Filled 2021-04-08: qty 20

## 2021-04-08 MED ORDER — SODIUM CHLORIDE 0.9 % IV BOLUS
1000.0000 mL | Freq: Once | INTRAVENOUS | Status: AC
  Administered 2021-04-08: 1000 mL via INTRAVENOUS

## 2021-04-08 MED ORDER — LINAGLIPTIN 5 MG PO TABS: 5.0000 mg | ORAL_TABLET | Freq: Every day | ORAL | Status: DC

## 2021-04-08 MED ORDER — ONDANSETRON HCL 4 MG PO TABS: 4.0000 mg | ORAL_TABLET | Freq: Four times a day (QID) | ORAL | Status: DC | PRN

## 2021-04-08 MED ORDER — FAMOTIDINE 20 MG PO TABS
20.0000 mg | ORAL_TABLET | ORAL | Status: DC
  Administered 2021-04-09 – 2021-04-13 (×5): 20 mg via ORAL
  Filled 2021-04-08 (×5): qty 1

## 2021-04-08 MED ORDER — ENOXAPARIN SODIUM 40 MG/0.4ML IJ SOSY
40.0000 mg | PREFILLED_SYRINGE | INTRAMUSCULAR | Status: DC
  Administered 2021-04-09 – 2021-04-14 (×3): 40 mg via SUBCUTANEOUS
  Filled 2021-04-08 (×6): qty 0.4

## 2021-04-08 MED ORDER — GUAIFENESIN-DM 100-10 MG/5ML PO SYRP: 10.0000 mL | ORAL_SOLUTION | ORAL | Status: DC | PRN

## 2021-04-08 MED ORDER — ASPIRIN 81 MG PO CHEW
81.0000 mg | CHEWABLE_TABLET | Freq: Every day | ORAL | Status: DC
  Administered 2021-04-09 – 2021-04-13 (×5): 81 mg via ORAL
  Filled 2021-04-08 (×5): qty 1

## 2021-04-08 MED ORDER — SENNOSIDES-DOCUSATE SODIUM 8.6-50 MG PO TABS: 1.0000 | ORAL_TABLET | Freq: Every evening | ORAL | Status: DC | PRN

## 2021-04-08 MED ORDER — SODIUM CHLORIDE 0.9 % IV SOLN: INTRAVENOUS | Status: AC

## 2021-04-08 NOTE — Progress Notes (Signed)
New admission arrived to unit. Pt aox3. VSS. Pt son at bedside. Admitting MD paged.

## 2021-04-08 NOTE — ED Provider Notes (Signed)
[facility] EMERGENCY DEPARTMENT Provider Note   CSN: [id number] Arrival date & time: 04/08/21  1654     History Chief Complaint  Patient presents with   Altered Mental Status    Leslie Sherman is a 87 y.o. female.  Overall history is limited.  Supposedly patient is hard of hearing does have dementia.  She denies any pain.  EMS states that she is not acting at her baseline per nursing staff.  Talked with family member on the phone who was told the same thing.  Supposedly had a strong smell of urine when she is picked up.  The history is provided by the patient.  Altered Mental Status Presenting symptoms: confusion   Severity:  Mild Most recent episode:  Today Episode history:  Single Timing:  Constant Progression:  Unchanged Chronicity:  New Context: dementia (stroke hx with left sided weakness?)       Past Medical History:  Diagnosis Date   Dementia (HCC)    Hyperlipidemia    Hypertension    Hypopotassemia    Metabolic encephalopathy    Stroke [facility])     Patient Active Problem List   Diagnosis Date Noted   UTI (urinary tract infection) 04/08/2021     Past Surgical History:  Procedure Laterality Date   unknown     unknown     OB History   No obstetric history on file.     No family history on file.  Social History   Tobacco Use   Smoking status: Never   Smokeless tobacco: Never  Substance Use Topics   Alcohol use: No   Drug use: No    Home Medications Prior to Admission medications   Medication Sig Start Date End Date Taking? Authorizing Provider  acetaminophen (TYLENOL) 325 MG tablet Take 325 mg by mouth every 8 (eight) hours as needed for headache.    [provider]  acetaminophen (TYLENOL) 500 MG tablet Take 500 mg by mouth every 8 (eight) hours as needed for mild pain.    [provider]  aspirin 81 MG chewable tablet Chew 81 mg by mouth daily.    [provider]  atorvastatin (LIPITOR) 40 MG tablet  Take 40 mg by mouth at bedtime.     [provider]  clopidogrel (PLAVIX) 75 MG tablet Take 75 mg by mouth daily.    [provider]  docusate sodium (COLACE) 100 MG capsule Take 100 mg by mouth at bedtime.    [provider]  famotidine (PEPCID) 20 MG tablet Take 20 mg by mouth every morning.     [provider]  hydrALAZINE (APRESOLINE) 25 MG tablet Take 25 mg by mouth 3 (three) times daily.    [provider]  metFORMIN (GLUCOPHAGE) 500 MG tablet Take 500 mg by mouth daily with breakfast.    [provider]  metoprolol (LOPRESSOR) 50 MG tablet Take 50 mg by mouth 2 (two) times daily.    [provider]  Multiple Vitamin (MULTIVITAMIN) capsule Take 1 capsule by mouth daily.    [provider]  oxyCODONE-acetaminophen (PERCOCET/ROXICET) 5-325 MG tablet Take by mouth every 6 (six) hours as needed for severe pain.    [provider]    Allergies    Patient has no known allergies.  Review of Systems   Review of Systems  Unable to perform ROS: Dementia  Psychiatric/Behavioral:  Positive for confusion.    Physical Exam Updated Vital Signs  ED Triage Vitals  Enc Vitals  Group     BP 04/08/21 1658 (!) 155/70     Pulse Rate 04/08/21 1658 87     Resp 04/08/21 1658 (!) 22     Temp 04/08/21 1658 98.5 F (36.9 C)     Temp Source 04/08/21 1658 Oral     SpO2 04/08/21 1658 94 %     Weight 04/08/21 1704 184 lb 1.4 oz (83.5 kg)     Height 04/08/21 1704 5\' 4"  (1.626 m)     Head Circumference --      Peak Flow --      Pain Score 04/08/21 1704 0     Pain Loc --      Pain Edu? --      Excl. in GC? --     Physical Exam Vitals and nursing note reviewed.  Constitutional:      General: She is not in acute distress.    Appearance: She is well-developed. She is not ill-appearing.  HENT:     Head: Normocephalic and atraumatic.     Mouth/Throat:     Mouth: Mucous membranes are moist.  Eyes:     Extraocular  Movements: Extraocular movements intact.     Conjunctiva/sclera: Conjunctivae normal.     Pupils: Pupils are equal, round, and reactive to light.  Cardiovascular:     Rate and Rhythm: Normal rate and regular rhythm.     Pulses: Normal pulses.     Heart sounds: Normal heart sounds. No murmur heard. Pulmonary:     Effort: Pulmonary effort is normal. No respiratory distress.     Breath sounds: Normal breath sounds.  Abdominal:     General: There is no distension.     Palpations: Abdomen is soft.     Tenderness: There is no abdominal tenderness.  Musculoskeletal:        General: Normal range of motion.     Cervical back: Normal range of motion and neck supple.  Skin:    General: Skin is warm and dry.  Neurological:     General: No focal deficit present.     Mental Status: She is alert.     Comments: Patient is slightly slow to answer questions but moves all extremities, no obvious facial droop    ED Results / Procedures / Treatments   Labs (all labs ordered are listed, but only abnormal results are displayed) Labs Reviewed  CBC WITH DIFFERENTIAL/PLATELET - Abnormal; Notable for the following components:      Result Value   RDW 17.1 (*)    All other components within normal limits  COMPREHENSIVE METABOLIC PANEL - Abnormal; Notable for the following components:   Glucose, Bld 134 (*)    Total Protein 8.2 (*)    AST 45 (*)    Alkaline Phosphatase 135 (*)    All other components within normal limits  URINALYSIS, ROUTINE W REFLEX MICROSCOPIC - Abnormal; Notable for the following components:   APPearance CLOUDY (*)    Protein, ur 30 (*)    Nitrite POSITIVE (*)    Leukocytes,Ua SMALL (*)    All other components within normal limits  LACTIC ACID, PLASMA - Abnormal; Notable for the following components:   Lactic Acid, Venous 2.8 (*)    All other components within normal limits  URINALYSIS, MICROSCOPIC (REFLEX) - Abnormal; Notable for the following components:   Bacteria, UA MANY  (*)    All other components within normal limits  CBG MONITORING, ED - Abnormal; Notable for the following components:  Glucose-Capillary 128 (*)    All other components within normal limits  URINE CULTURE  CULTURE, BLOOD (ROUTINE X 2)  CULTURE, BLOOD (ROUTINE X 2)  RESP PANEL BY RT-PCR (FLU A&B, COVID) ARPGX2  LIPASE, BLOOD  LACTIC ACID, PLASMA    EKG EKG Interpretation  Date/Time:  Sunday April 08 2021 17:24:44 EDT Ventricular Rate:  87 PR Interval:  74 QRS Duration: 84 QT Interval:  367 QTC Calculation: 442 R Axis:   37 Text Interpretation: Sinus rhythm Short PR interval Anteroseptal infarct, old Confirmed by Virgina Norfolk [phone]) on 04/08/2021 6:06:11 PM  Radiology CT Head Wo Contrast  Result Date: 04/08/2021 CLINICAL DATA:  Altered mental status EXAM: CT HEAD WITHOUT CONTRAST TECHNIQUE: Contiguous axial images were obtained from the base of the skull through the vertex without intravenous contrast. COMPARISON:  01/05/2021 FINDINGS: Brain: Old left basal ganglia lacunar infarct. There is atrophy and chronic small vessel disease changes. No acute intracranial abnormality. Specifically, no hemorrhage, hydrocephalus, mass lesion, acute infarction, or significant intracranial injury. Vascular: No hyperdense vessel or unexpected calcification. Skull: No acute calvarial abnormality. Sinuses/Orbits: Areas of mucosal thickening.  No air-fluid levels. Other: None IMPRESSION: Old left basal ganglia lacunar infarct. Atrophy, chronic microvascular disease. No acute intracranial abnormality. Electronically Signed   By: Charlett Nose M.D.   On: 04/08/2021 18:27   DG Chest Portable 1 View  Result Date: 04/08/2021 CLINICAL DATA:  Altered mental status EXAM: PORTABLE CHEST 1 VIEW COMPARISON:  02/01/2016 FINDINGS: Heart is normal size. Aortic atherosclerosis. No confluent opacities or effusions. No acute bony abnormality. IMPRESSION: No active disease. Electronically Signed   By: Charlett Nose M.D.    On: 04/08/2021 18:28    Procedures Procedures   Medications Ordered in ED Medications  sodium chloride 0.9 % bolus 1,000 mL (1,000 mLs Intravenous New Bag/Given 04/08/21 1815)  cefTRIAXone (ROCEPHIN) 2 g in sodium chloride 0.9 % 100 mL IVPB (0 g Intravenous Stopped 04/08/21 1848)    ED Course  I have reviewed the triage vital signs and the nursing notes.  Pertinent labs & imaging results that were available during my care of the patient were reviewed by me and considered in my medical decision making (see chart for details).    MDM Rules/Calculators/A&P                           Ameila Weldon is here with confusion.  Unremarkable vitals.  No fever.  Concern for possible urine infection.  Patient with overall some mild confusion.  Not sure what her baseline is.  Does not appear to have anything consistent with a stroke on exam.  May be some trace left-sided weakness at baseline per family.  Patient appears to have urinary tract infection.  Lactic acid of 2.8.  No leukocytosis.  Lab work is otherwise unremarkable.  She appears clinically dry on exam.  Given lactic acidosis, confusion from her baseline UTI will admit for hydration and IV antibiotics.  Does not appear to be consistent with sepsis given vital signs and lab work at this time but blood cultures have been sent.  To be admitted to medicine for further care.  Head CT was unremarkable.  This chart was dictated using voice recognition software.  Despite best efforts to proofread,  errors can occur which can change the documentation meaning.    Final Clinical Impression(s) / ED Diagnoses Final diagnoses:  Acute cystitis without hematuria  Altered mental status, unspecified altered mental status type  Rx / DC Orders ED Discharge Orders     None        Virgina Norfolk, DO 04/08/21 1939

## 2021-04-08 NOTE — ED Notes (Signed)
Nasal swab repeated due limited specimen availability on initial swab

## 2021-04-08 NOTE — ED Triage Notes (Signed)
BIBA for AMS from assisted living. Per EMS, pt has had dysuria/frequency x 3 days, was found by staff today to be AMS, not wanting to do activities. +foul smell of urine on arrival.

## 2021-04-08 NOTE — H&P (Signed)
History and Physical    Abbagail Scaff NIO:[id number] DOB: 24-Jan-1925 DOA: 04/08/2021  PCP: Leola Brazil, DO   Patient coming from: ALF   Chief Complaint: Dysuria, urinary frequency, lethargy, increased confusion, not eating or drinking   HPI: Dillyn Joaquin is a very pleasant 87 y.o. female with medical history significant for dementia, hypertension, type 2 diabetes mellitus, and history of CVA, now presenting to the emergency department from her ALF for evaluation of increased confusion, lethargy, anorexia, dysuria, and urinary frequency.  Patient appeared to be in her usual state on 04/04/2021 when her daughter was visiting her but was noted to have urinary frequency and complaint of dysuria shortly after this.  Urinary issues continued and then today she was lethargic, not engaging in her usual activities, and not eating or drinking.  Patient provides some limited history, acknowledges recent dysuria, denies abdominal or flank pain, and denies nausea or vomiting.  She has received 4 immunizations for COVID-19, tested positive on March 05, 2021, had severe malaise and fatigue per report of family, but fully recovered from that and was taken off of isolation approximately 3 weeks ago.  [facility] ED Course: Upon arrival to the ED, patient is found to be afebrile, saturating well on room air, and with stable blood pressure.  EKG features sinus rhythm and chest x-ray negative for acute cardiopulmonary disease.  Head CT negative for acute findings but notable for an old left basal ganglia lacunar infarction.  Chemistry panel features mild elevation in alkaline phosphatase, AST, and total protein.  CBC is unremarkable.  Lactic acid elevated to 2.8.  COVID-19 PCR was positive with cycle threshold 42.1.  Blood and urine cultures were collected in the emergency department and the patient was treated with Rocephin and a liter of saline prior to being transferred to [facility] for ongoing management.  Review of  Systems:  ROS limited by the patient's clinical condition.  Past Medical History:  Diagnosis Date   Dementia (HCC)    Hyperlipidemia    Hypertension    Hypopotassemia    Metabolic encephalopathy    Stroke [facility])     Past Surgical History:  Procedure Laterality Date   unknown     unknown    Social History:   reports that she has never smoked. She has never used smokeless tobacco. She reports that she does not drink alcohol and does not use drugs.  No Known Allergies  History reviewed. No pertinent family history.   Prior to Admission medications   Medication Sig Start Date End Date Taking? Authorizing Provider  acetaminophen (TYLENOL) 325 MG tablet Take 325 mg by mouth every 8 (eight) hours as needed for headache.    [provider]  acetaminophen (TYLENOL) 500 MG tablet Take 500 mg by mouth every 8 (eight) hours as needed for mild pain.    [provider]  aspirin 81 MG chewable tablet Chew 81 mg by mouth daily.    [provider]  atorvastatin (LIPITOR) 40 MG tablet Take 40 mg by mouth at bedtime.     [provider]  clopidogrel (PLAVIX) 75 MG tablet Take 75 mg by mouth daily.    [provider]  docusate sodium (COLACE) 100 MG capsule Take 100 mg by mouth at bedtime.    [provider]  famotidine (PEPCID) 20 MG tablet Take 20 mg by mouth every morning.     [provider]  hydrALAZINE (APRESOLINE) 25 MG tablet Take 25 mg by mouth 3 (three) times  daily.    [provider]  metFORMIN (GLUCOPHAGE) 500 MG tablet Take 500 mg by mouth daily with breakfast.    [provider]  metoprolol (LOPRESSOR) 50 MG tablet Take 50 mg by mouth 2 (two) times daily.    [provider]  Multiple Vitamin (MULTIVITAMIN) capsule Take 1 capsule by mouth daily.    [provider]  oxyCODONE-acetaminophen (PERCOCET/ROXICET) 5-325 MG tablet Take by mouth every 6 (six) hours as needed for severe pain.     [provider]    Physical Exam: Vitals:   04/08/21 1730 04/08/21 1745 04/08/21 2145 04/08/21 2237  BP: (!) 144/69  (!) 142/63 (!) 131/56  Pulse: 83  77 75  Resp: 20  (!) 27 16  Temp:  99.1 F (37.3 C)  98.7 F (37.1 C)  TempSrc:  Rectal  Oral  SpO2: 94%  95% 97%  Weight:      Height:        Constitutional: NAD, calm  Eyes: PERTLA, lids and conjunctivae normal ENMT: Mucous membranes are moist. Posterior pharynx clear of any exudate or lesions.   Neck:  supple, no masses  Respiratory:  no wheezing, no crackles. No accessory muscle use.  Cardiovascular: S1 & S2 heard, regular rate and rhythm. No extremity edema.   Abdomen: No distension, no tenderness, soft. Bowel sounds active.  Musculoskeletal: no clubbing / cyanosis. No joint deformity upper and lower extremities.   Skin: no significant rashes, lesions, ulcers. Warm, dry, well-perfused. Neurologic: Hearing deficit, CN 2-12 grossly intact otherwise. Moving all extremities. Sleeping, wakes to voice. Oriented to person and place only.  Psychiatric: Very pleasant. Cooperative.    Labs and Imaging on Admission: I have personally reviewed following labs and imaging studies  CBC: Recent Labs  Lab 04/08/21 1732  WBC 5.7  NEUTROABS 3.8  HGB 12.8  HCT 39.9  MCV 85.8  PLT 294   Basic Metabolic Panel: Recent Labs  Lab 04/08/21 1732  NA 141  K 3.6  CL 106  CO2 26  GLUCOSE 134*  BUN 11  CREATININE 0.83  CALCIUM 9.4   GFR: Estimated Creatinine Clearance: 43.3 mL/min (by C-G formula based on SCr of 0.83 mg/dL). Liver Function Tests: Recent Labs  Lab 04/08/21 1732  AST 45*  ALT 25  ALKPHOS 135*  BILITOT 0.7  PROT 8.2*  ALBUMIN 4.0   Recent Labs  Lab 04/08/21 1732  LIPASE 31   No results for input(s): AMMONIA in the last 168 hours. Coagulation Profile: No results for input(s): INR, PROTIME in the last 168 hours. Cardiac Enzymes: No results for input(s): CKTOTAL, CKMB, CKMBINDEX, TROPONINI in the  last 168 hours. BNP (last 3 results) No results for input(s): PROBNP in the last 8760 hours. HbA1C: No results for input(s): HGBA1C in the last 72 hours. CBG: Recent Labs  Lab 04/08/21 1704  GLUCAP 128*   Lipid Profile: No results for input(s): CHOL, HDL, LDLCALC, TRIG, CHOLHDL, LDLDIRECT in the last 72 hours. Thyroid Function Tests: No results for input(s): TSH, T4TOTAL, FREET4, T3FREE, THYROIDAB in the last 72 hours. Anemia Panel: No results for input(s): VITAMINB12, FOLATE, FERRITIN, TIBC, IRON, RETICCTPCT in the last 72 hours. Urine analysis:    Component Value Date/Time   COLORURINE YELLOW 04/08/2021 1745   APPEARANCEUR CLOUDY (A) 04/08/2021 1745   LABSPEC 1.030 04/08/2021 1745   PHURINE 5.5 04/08/2021 1745   GLUCOSEU NEGATIVE 04/08/2021 1745   HGBUR NEGATIVE 04/08/2021 1745   BILIRUBINUR NEGATIVE 04/08/2021 1745   KETONESUR NEGATIVE 04/08/2021  1745   PROTEINUR 30 (A) 04/08/2021 1745   UROBILINOGEN 1.0 10/23/2012 1330   NITRITE POSITIVE (A) 04/08/2021 1745   LEUKOCYTESUR SMALL (A) 04/08/2021 1745   Sepsis Labs: @LABRCNTIP (procalcitonin:4,lacticidven:4) ) Recent Results (from the past 240 hour(s))  Resp Panel by RT-PCR (Flu A&B, Covid)     Status: Abnormal   Collection Time: 04/08/21  7:07 PM  Result Value Ref Range Status   SARS Coronavirus 2 by RT PCR POSITIVE (A) NEGATIVE Final    Comment: RESULT CALLED TO, READ BACK BY AND VERIFIED WITH:  POWELL,VR RN @1958  04/08/21 EDENSCA (NOTE) SARS-CoV-2 target nucleic acids are DETECTED.  The SARS-CoV-2 RNA is generally detectable in upper respiratory specimens during the acute phase of infection. Positive results are indicative of the presence of the identified virus, but do not rule out bacterial infection or co-infection with other pathogens not detected by the test. Clinical correlation with patient history and other diagnostic information is necessary to determine patient infection status. The expected result is  Negative.  Fact Sheet for Patients:  Fact Sheet for Healthcare Providers: 06/08/21  This test is not yet approved or cleared by the [employer] FDA and  has been authorized for detection and/or diagnosis of SARS-CoV-2 by FDA under an Emergency Use Authorization (EUA).  This EUA will remain in effect (meaning this test can  be used) for the duration of  the COVID-19 declaration under Section 564(b)(1) of the Act, 21 U.S.C. section 360bbb-3(b)(1), unless the authorization is terminated or revoked sooner.     Influenza A by PCR NEGATIVE NEGATIVE Final   Influenza B by PCR NEGATIVE NEGATIVE Final    Comment: (NOTE) The Xpert Xpress SARS-CoV-2/FLU/RSV plus assay is intended as an aid in the diagnosis of influenza from Nasopharyngeal swab specimens and should not be used as a sole basis for treatment. Nasal washings and aspirates are unacceptable for Xpert Xpress SARS-CoV-2/FLU/RSV testing.  Fact Sheet for Patients: SeriousBroker.it  Fact Sheet for Healthcare Providers: [place]  This test is not yet approved or cleared by the [employer] FDA and has been authorized for detection and/or diagnosis of SARS-CoV-2 by FDA under an Emergency Use Authorization (EUA). This EUA will remain in effect (meaning this test can be used) for the duration of the COVID-19 declaration under Section 564(b)(1) of the Act, 21 U.S.C. section 360bbb-3(b)(1), unless the authorization is terminated or revoked.  Performed at [facility], [address] [place], [address] [place]      Radiological Exams on Admission: CT Head Wo Contrast  Result Date: 04/08/2021 CLINICAL DATA:  Altered mental status EXAM: CT HEAD WITHOUT CONTRAST TECHNIQUE: Contiguous axial images were obtained from the base of the skull through the vertex without intravenous contrast.  COMPARISON:  01/05/2021 FINDINGS: Brain: Old left basal ganglia lacunar infarct. There is atrophy and chronic small vessel disease changes. No acute intracranial abnormality. Specifically, no hemorrhage, hydrocephalus, mass lesion, acute infarction, or significant intracranial injury. Vascular: No hyperdense vessel or unexpected calcification. Skull: No acute calvarial abnormality. Sinuses/Orbits: Areas of mucosal thickening.  No air-fluid levels. Other: None IMPRESSION: Old left basal ganglia lacunar infarct. Atrophy, chronic microvascular disease. No acute intracranial abnormality. Electronically Signed   By: 06/08/2021 M.D.   On: 04/08/2021 18:27   DG Chest Portable 1 View  Result Date: 04/08/2021 CLINICAL DATA:  Altered mental status EXAM: PORTABLE CHEST 1 VIEW COMPARISON:  02/01/2016 FINDINGS: Heart is normal size. Aortic atherosclerosis. No confluent opacities or effusions. No acute bony abnormality. IMPRESSION:  No active disease. Electronically Signed   By: Charlett Nose M.D.   On: 04/08/2021 18:28    EKG: Independently reviewed. Sinus rhythm.   Assessment/Plan   1. Acute encephalopathy  - Presents from ALF with one day of lethargy, increased confusion, and anorexia after 3 days of dysuria and urinary frequency  - No acute findings on head CT  - Likely secondary to UTI and dehydration  - Continue antibiotic and IVF hydration, continue supportive care    2. UTI  - Presents with one day of lethargy and increased confusion and 3 days of dysuria and urinary frequency  - UA compatible with infection, blood and urine were sent for culture, and Rocephin started in ED  - Continue Rocephin, follow cultures and clinical course   3. Hypertension  - Continue Lopressor and hydralazine   4. Type II DM  - A1c was 6.2% in May 2022  - Use low-intensity SSI if needed for now    5. Hx of CVA  - Continue ASA, Plavix, and statin    6. Positive COVID test  - She tested + at ALF on 03/05/21, had  severe fatigue/malaise but fully recovered prior to this current illness  - CT is high and the current + test reflects residual shedding from the infxn 4-5 wks ago and she no longer requires isolation     DVT prophylaxis: Lovenox  Code Status: DNR, confirmed on admission  Level of Care: Level of care: Med-Surg Family Communication: Discussed with daughter Faydra Korman) by phone  Disposition Plan:  Patient is from: ALF  Anticipated d/c is to: ALF  Anticipated d/c date is: 04/10/21 Patient currently: Pending improvement in mental status, ability to tolerate adequate oral intake  Consults called: none  Admission status: Observation    Briscoe Deutscher, MD [facility]  04/08/2021, 11:50 PM

## 2021-04-08 NOTE — Plan of Care (Signed)
  Problem: Education: Goal: Knowledge of General Education information will improve Description: Including pain rating scale, medication(s)/side effects and non-pharmacologic comfort measures Outcome: Progressing   Problem: Health Behavior/Discharge Planning: Goal: Ability to manage health-related needs will improve Outcome: Progressing   Problem: Clinical Measurements: Goal: Ability to maintain clinical measurements within normal limits will improve Outcome: Progressing Goal: Will remain free from infection Outcome: Progressing Goal: Diagnostic test results will improve Outcome: Progressing Goal: Cardiovascular complication will be avoided Outcome: Progressing   Problem: Nutrition: Goal: Adequate nutrition will be maintained Outcome: Progressing   Problem: Elimination: Goal: Will not experience complications related to urinary retention Outcome: Progressing   Problem: Pain Managment: Goal: General experience of comfort will improve Outcome: Progressing   Problem: Safety: Goal: Ability to remain free from injury will improve Outcome: Progressing   Problem: Skin Integrity: Goal: Risk for impaired skin integrity will decrease Outcome: Progressing

## 2021-04-09 DIAGNOSIS — F0390 Unspecified dementia without behavioral disturbance: Secondary | ICD-10-CM | POA: Diagnosis present

## 2021-04-09 DIAGNOSIS — N390 Urinary tract infection, site not specified: Secondary | ICD-10-CM | POA: Diagnosis present

## 2021-04-09 DIAGNOSIS — Z8673 Personal history of transient ischemic attack (TIA), and cerebral infarction without residual deficits: Secondary | ICD-10-CM | POA: Diagnosis not present

## 2021-04-09 DIAGNOSIS — Z79899 Other long term (current) drug therapy: Secondary | ICD-10-CM | POA: Diagnosis not present

## 2021-04-09 DIAGNOSIS — G9340 Encephalopathy, unspecified: Secondary | ICD-10-CM | POA: Diagnosis not present

## 2021-04-09 DIAGNOSIS — E860 Dehydration: Secondary | ICD-10-CM | POA: Diagnosis present

## 2021-04-09 DIAGNOSIS — N3000 Acute cystitis without hematuria: Secondary | ICD-10-CM | POA: Diagnosis not present

## 2021-04-09 DIAGNOSIS — E119 Type 2 diabetes mellitus without complications: Secondary | ICD-10-CM | POA: Diagnosis present

## 2021-04-09 DIAGNOSIS — R630 Anorexia: Secondary | ICD-10-CM | POA: Diagnosis present

## 2021-04-09 DIAGNOSIS — G9341 Metabolic encephalopathy: Secondary | ICD-10-CM | POA: Diagnosis present

## 2021-04-09 DIAGNOSIS — Z7902 Long term (current) use of antithrombotics/antiplatelets: Secondary | ICD-10-CM | POA: Diagnosis not present

## 2021-04-09 DIAGNOSIS — B9620 Unspecified Escherichia coli [E. coli] as the cause of diseases classified elsewhere: Secondary | ICD-10-CM | POA: Diagnosis present

## 2021-04-09 DIAGNOSIS — Z6831 Body mass index (BMI) 31.0-31.9, adult: Secondary | ICD-10-CM | POA: Diagnosis not present

## 2021-04-09 DIAGNOSIS — Z91128 Patient's intentional underdosing of medication regimen for other reason: Secondary | ICD-10-CM | POA: Diagnosis not present

## 2021-04-09 DIAGNOSIS — Z8616 Personal history of COVID-19: Secondary | ICD-10-CM | POA: Diagnosis not present

## 2021-04-09 DIAGNOSIS — Z66 Do not resuscitate: Secondary | ICD-10-CM | POA: Diagnosis present

## 2021-04-09 DIAGNOSIS — I10 Essential (primary) hypertension: Secondary | ICD-10-CM | POA: Diagnosis present

## 2021-04-09 DIAGNOSIS — E669 Obesity, unspecified: Secondary | ICD-10-CM | POA: Diagnosis present

## 2021-04-09 DIAGNOSIS — E785 Hyperlipidemia, unspecified: Secondary | ICD-10-CM | POA: Diagnosis present

## 2021-04-09 DIAGNOSIS — E876 Hypokalemia: Secondary | ICD-10-CM | POA: Diagnosis present

## 2021-04-09 DIAGNOSIS — Z7982 Long term (current) use of aspirin: Secondary | ICD-10-CM | POA: Diagnosis not present

## 2021-04-09 DIAGNOSIS — Z7984 Long term (current) use of oral hypoglycemic drugs: Secondary | ICD-10-CM | POA: Diagnosis not present

## 2021-04-09 LAB — CBC WITH DIFFERENTIAL/PLATELET
Abs Immature Granulocytes: 0.01 10*3/uL (ref 0.00–0.07)
Basophils Absolute: 0 10*3/uL (ref 0.0–0.1)
Basophils Relative: 0 %
Eosinophils Absolute: 0 10*3/uL (ref 0.0–0.5)
Eosinophils Relative: 1 %
HCT: 38.2 % (ref 36.0–46.0)
Hemoglobin: 11.6 g/dL — ABNORMAL LOW (ref 12.0–15.0)
Immature Granulocytes: 0 %
Lymphocytes Relative: 28 %
Lymphs Abs: 1.4 10*3/uL (ref 0.7–4.0)
MCH: 27.1 pg (ref 26.0–34.0)
MCHC: 30.4 g/dL (ref 30.0–36.0)
MCV: 89.3 fL (ref 80.0–100.0)
Monocytes Absolute: 0.4 10*3/uL (ref 0.1–1.0)
Monocytes Relative: 7 %
Neutro Abs: 3.3 10*3/uL (ref 1.7–7.7)
Neutrophils Relative %: 64 %
Platelets: 224 10*3/uL (ref 150–400)
RBC: 4.28 MIL/uL (ref 3.87–5.11)
RDW: 17.2 % — ABNORMAL HIGH (ref 11.5–15.5)
WBC: 5.1 10*3/uL (ref 4.0–10.5)
nRBC: 0 % (ref 0.0–0.2)

## 2021-04-09 LAB — GLUCOSE, CAPILLARY
Glucose-Capillary: 103 mg/dL — ABNORMAL HIGH (ref 70–99)
Glucose-Capillary: 125 mg/dL — ABNORMAL HIGH (ref 70–99)
Glucose-Capillary: 89 mg/dL (ref 70–99)
Glucose-Capillary: 90 mg/dL (ref 70–99)
Glucose-Capillary: 98 mg/dL (ref 70–99)

## 2021-04-09 LAB — PHOSPHORUS: Phosphorus: 3 mg/dL (ref 2.5–4.6)

## 2021-04-09 LAB — COMPREHENSIVE METABOLIC PANEL
ALT: 25 U/L (ref 0–44)
AST: 50 U/L — ABNORMAL HIGH (ref 15–41)
Albumin: 3.6 g/dL (ref 3.5–5.0)
Alkaline Phosphatase: 122 U/L (ref 38–126)
Anion gap: 7 (ref 5–15)
BUN: 8 mg/dL (ref 8–23)
CO2: 23 mmol/L (ref 22–32)
Calcium: 8.7 mg/dL — ABNORMAL LOW (ref 8.9–10.3)
Chloride: 112 mmol/L — ABNORMAL HIGH (ref 98–111)
Creatinine, Ser: 0.67 mg/dL (ref 0.44–1.00)
GFR, Estimated: >60 mL/min (ref >60)
Glucose, Bld: 140 mg/dL — ABNORMAL HIGH (ref 70–99)
Potassium: 3.4 mmol/L — ABNORMAL LOW (ref 3.5–5.1)
Sodium: 142 mmol/L (ref 135–145)
Total Bilirubin: 1 mg/dL (ref 0.3–1.2)
Total Protein: 7.3 g/dL (ref 6.5–8.1)

## 2021-04-09 LAB — MAGNESIUM: Magnesium: 1.8 mg/dL (ref 1.7–2.4)

## 2021-04-09 NOTE — Progress Notes (Signed)
PROGRESS NOTE    Leslie Sherman  UEA:[id number] DOB: January 04, 1925 DOA: 04/08/2021 PCP: Leola Brazil, DO   Brief Narrative:   Leslie Sherman is a very pleasant 87 y.o. female with medical history significant for dementia, hypertension, type 2 diabetes mellitus, and history of CVA, now presenting to the emergency department from her ALF for evaluation of increased confusion, lethargy, anorexia, dysuria, and urinary frequency.  Patient admitted for acute metabolic encephalopathy in the setting of likely UTI, she does not meet sepsis criteria at intake.  Of note patient has received 4 immunizations for COVID-19, tested positive on March 05, 2021 and has completed her isolation   Assessment & Plan:   Acute metabolic encephalopathy, POA  -Presented from assisted living with worsening confusion lethargy and anorexia  -Unknown baseline, will attempt to confirm with family  -Patient awake alert oriented to person only at this point, difficulty following conversation and commands today  -Continue supportive care, antibiotics, follow for polypharmacy or sedating medications and hold as appropriate   UTI, does NOT meet sepsis criteria -Most likely etiology for increased confusion lethargy dysuria and urinary frequency -Cultures pending, continue Rocephin until speciation and sensitivities have resulted -continue for 3-day course of antibiotics, will consider extending to 5-day course if patient clinically not improving back to baseline in the next 48 hours  Hypertension  - Continue home medications including Lopressor and hydralazine    Type II DM, non insulin dependent, well controlled - A1c was 6.2% in May 2022  -Continue low-dose sliding scale, follow hypoglycemic protocol, monitor closely given poor p.o. intake in the setting of mental status changes and likely advanced   Prior CVA  - Continue ASA, Plavix, and statin    Positive COVID test  -Patient previously tested positive on 03/05/2021  completed 3-week quarantine no longer having symptoms, no indication to test patient given high likelihood of false positive, unfortunately patient was swabbed at admission and found to be incidentally positive; likely continued viral shedding of inactive virus status postacute illness and recovery   DVT prophylaxis: Lovenox  Code Status: DNR, confirmed on admission  Family Communication: None present  Status is: In patient  Dispo: The patient is from: Assisted living facility              Anticipated d/c is to: To be determined              Anticipated d/c date is: 24 to 48 hours              Patient currently not medically stable for discharge  Consultants:  None  Procedures:  None  Antimicrobials:  Ceftriaxone x3 days  Subjective: No acute issues or events overnight, patient somewhat somnolent early this morning, reports she did not sleep well but otherwise denies nausea vomiting diarrhea constipation headache fevers chills or chest pain  Objective: Vitals:   04/08/21 2145 04/08/21 2237 04/09/21 0547 04/09/21 0549  BP: (!) 142/63 (!) 131/56 (!) 171/67 (!) 116/54  Pulse: 77 75 88 89  Resp: (!) 27 16 18    Temp:  98.7 F (37.1 C) 98.4 F (36.9 C)   TempSrc:  Oral Oral   SpO2: 95% 97% 91%   Weight:      Height:        Intake/Output Summary (Last 24 hours) at 04/09/2021 0729 Last data filed at 04/09/2021 0729 Gross per 24 hour  Intake 218.08 ml  Output 300 ml  Net -81.92 ml   Filed Weights   04/08/21 1704  Weight: 83.5 kg    Examination:  General:  Pleasantly resting in bed, No acute distress.  Alert to person only HEENT:  Normocephalic atraumatic.  Sclerae nonicteric, noninjected.  Extraocular movements intact bilaterally. Neck:  Without mass or deformity.  Trachea is midline. Lungs:  Clear to auscultate bilaterally without rhonchi, wheeze, or rales. Heart:  Regular rate and rhythm.  Without murmurs, rubs, or gallops. Abdomen:  Soft, nontender, nondistended.   Without guarding or rebound. Extremities: Without cyanosis, clubbing, edema, or obvious deformity. Vascular:  Dorsalis pedis and posterior tibial pulses palpable bilaterally. Skin:  Warm and dry, no erythema, no ulcerations.   Data Reviewed: I have personally reviewed following labs and imaging studies  CBC: Recent Labs  Lab 04/08/21 1732  WBC 5.7  NEUTROABS 3.8  HGB 12.8  HCT 39.9  MCV 85.8  PLT 294   Basic Metabolic Panel: Recent Labs  Lab 04/08/21 1732  NA 141  K 3.6  CL 106  CO2 26  GLUCOSE 134*  BUN 11  CREATININE 0.83  CALCIUM 9.4   GFR: Estimated Creatinine Clearance: 43.3 mL/min (by C-G formula based on SCr of 0.83 mg/dL). Liver Function Tests: Recent Labs  Lab 04/08/21 1732  AST 45*  ALT 25  ALKPHOS 135*  BILITOT 0.7  PROT 8.2*  ALBUMIN 4.0   Recent Labs  Lab 04/08/21 1732  LIPASE 31   No results for input(s): AMMONIA in the last 168 hours. Coagulation Profile: No results for input(s): INR, PROTIME in the last 168 hours. Cardiac Enzymes: No results for input(s): CKTOTAL, CKMB, CKMBINDEX, TROPONINI in the last 168 hours. BNP (last 3 results) No results for input(s): PROBNP in the last 8760 hours. HbA1C: No results for input(s): HGBA1C in the last 72 hours. CBG: Recent Labs  Lab 04/08/21 1704 04/09/21 0013  GLUCAP 128* 125*   Lipid Profile: No results for input(s): CHOL, HDL, LDLCALC, TRIG, CHOLHDL, LDLDIRECT in the last 72 hours. Thyroid Function Tests: No results for input(s): TSH, T4TOTAL, FREET4, T3FREE, THYROIDAB in the last 72 hours. Anemia Panel: No results for input(s): VITAMINB12, FOLATE, FERRITIN, TIBC, IRON, RETICCTPCT in the last 72 hours. Sepsis Labs: Recent Labs  Lab 04/08/21 1732 04/08/21 2311  LATICACIDVEN 2.8* 1.9    Recent Results (from the past 240 hour(s))  Resp Panel by RT-PCR (Flu A&B, Covid)     Status: Abnormal   Collection Time: 04/08/21  7:07 PM  Result Value Ref Range Status   SARS Coronavirus 2 by  RT PCR POSITIVE (A) NEGATIVE Final    Comment: RESULT CALLED TO, READ BACK BY AND VERIFIED WITH:  POWELL,VR RN @1958  04/08/21 EDENSCA (NOTE) SARS-CoV-2 target nucleic acids are DETECTED.  The SARS-CoV-2 RNA is generally detectable in upper respiratory specimens during the acute phase of infection. Positive results are indicative of the presence of the identified virus, but do not rule out bacterial infection or co-infection with other pathogens not detected by the test. Clinical correlation with patient history and other diagnostic information is necessary to determine patient infection status. The expected result is Negative.  Fact Sheet for Patients: 06/08/21  Fact Sheet for Healthcare Providers: [employer].com  This test is not yet approved or cleared by the [employer] FDA and  has been authorized for detection and/or diagnosis of SARS-CoV-2 by FDA under an Emergency Use Authorization (EUA).  This EUA will remain in effect (meaning this test can  be used) for the duration of  the COVID-19 declaration under Section 564(b)(1) of the Act, 21 U.S.C. section  360bbb-3(b)(1), unless the authorization is terminated or revoked sooner.     Influenza A by PCR NEGATIVE NEGATIVE Final   Influenza B by PCR NEGATIVE NEGATIVE Final    Comment: (NOTE) The Xpert Xpress SARS-CoV-2/FLU/RSV plus assay is intended as an aid in the diagnosis of influenza from Nasopharyngeal swab specimens and should not be used as a sole basis for treatment. Nasal washings and aspirates are unacceptable for Xpert Xpress SARS-CoV-2/FLU/RSV testing.  Fact Sheet for Patients: BloggerCourse.com  Fact Sheet for Healthcare Providers: SeriousBroker.it  This test is not yet approved or cleared by the [place] FDA and has been authorized for detection and/or diagnosis of SARS-CoV-2 by FDA under an  Emergency Use Authorization (EUA). This EUA will remain in effect (meaning this test can be used) for the duration of the COVID-19 declaration under Section 564(b)(1) of the Act, 21 U.S.C. section 360bbb-3(b)(1), unless the authorization is terminated or revoked.  Performed at [facility], [address] [place], [place] [address]          Radiology Studies: CT Head Wo Contrast  Result Date: 04/08/2021 CLINICAL DATA:  Altered mental status EXAM: CT HEAD WITHOUT CONTRAST TECHNIQUE: Contiguous axial images were obtained from the base of the skull through the vertex without intravenous contrast. COMPARISON:  01/05/2021 FINDINGS: Brain: Old left basal ganglia lacunar infarct. There is atrophy and chronic small vessel disease changes. No acute intracranial abnormality. Specifically, no hemorrhage, hydrocephalus, mass lesion, acute infarction, or significant intracranial injury. Vascular: No hyperdense vessel or unexpected calcification. Skull: No acute calvarial abnormality. Sinuses/Orbits: Areas of mucosal thickening.  No air-fluid levels. Other: None IMPRESSION: Old left basal ganglia lacunar infarct. Atrophy, chronic microvascular disease. No acute intracranial abnormality. Electronically Signed   By: Charlett Nose M.D.   On: 04/08/2021 18:27   DG Chest Portable 1 View  Result Date: 04/08/2021 CLINICAL DATA:  Altered mental status EXAM: PORTABLE CHEST 1 VIEW COMPARISON:  02/01/2016 FINDINGS: Heart is normal size. Aortic atherosclerosis. No confluent opacities or effusions. No acute bony abnormality. IMPRESSION: No active disease. Electronically Signed   By: Charlett Nose M.D.   On: 04/08/2021 18:28        Scheduled Meds:  aspirin  81 mg Oral Daily   atorvastatin  40 mg Oral QHS   clopidogrel  75 mg Oral Daily   enoxaparin (LOVENOX) injection  40 mg Subcutaneous Q24H   famotidine  20 mg Oral [id number]   hydrALAZINE  25 mg Oral TID   insulin aspart  0-5 Units Subcutaneous QHS    insulin aspart  0-9 Units Subcutaneous TID WC   metoprolol tartrate  50 mg Oral BID   Continuous Infusions:  sodium chloride 75 mL/hr at 04/09/21 0013   cefTRIAXone (ROCEPHIN)  IV       LOS: 0 days    Time spent:    Azucena Fallen, DO [facility]  If 7PM-7AM, please contact night-coverage [email or web address]  04/09/2021, 7:29 AM

## 2021-04-10 DIAGNOSIS — G9340 Encephalopathy, unspecified: Secondary | ICD-10-CM | POA: Diagnosis not present

## 2021-04-10 LAB — BASIC METABOLIC PANEL
Anion gap: 5 (ref 5–15)
BUN: 8 mg/dL (ref 8–23)
CO2: 25 mmol/L (ref 22–32)
Calcium: 8.5 mg/dL — ABNORMAL LOW (ref 8.9–10.3)
Chloride: 112 mmol/L — ABNORMAL HIGH (ref 98–111)
Creatinine, Ser: 0.61 mg/dL (ref 0.44–1.00)
GFR, Estimated: >60 mL/min (ref >60)
Glucose, Bld: 95 mg/dL (ref 70–99)
Potassium: 3.4 mmol/L — ABNORMAL LOW (ref 3.5–5.1)
Sodium: 142 mmol/L (ref 135–145)

## 2021-04-10 LAB — CBC
HCT: 33.1 % — ABNORMAL LOW (ref 36.0–46.0)
Hemoglobin: 10.7 g/dL — ABNORMAL LOW (ref 12.0–15.0)
MCH: 27.9 pg (ref 26.0–34.0)
MCHC: 32.3 g/dL (ref 30.0–36.0)
MCV: 86.2 fL (ref 80.0–100.0)
Platelets: 221 10*3/uL (ref 150–400)
RBC: 3.84 MIL/uL — ABNORMAL LOW (ref 3.87–5.11)
RDW: 17.1 % — ABNORMAL HIGH (ref 11.5–15.5)
WBC: 4.3 10*3/uL (ref 4.0–10.5)
nRBC: 0 % (ref 0.0–0.2)

## 2021-04-10 LAB — GLUCOSE, CAPILLARY
Glucose-Capillary: 96 mg/dL (ref 70–99)
Glucose-Capillary: 98 mg/dL (ref 70–99)
Glucose-Capillary: 95 mg/dL (ref 70–99)
Glucose-Capillary: 103 mg/dL — ABNORMAL HIGH (ref 70–99)

## 2021-04-10 NOTE — Progress Notes (Signed)
Patient refused blood draw this AM, lab will come and try it later. We continue to monitor.

## 2021-04-10 NOTE — Progress Notes (Signed)
Patient refuses her AM Lovenox, patient educated but continue to refuse.

## 2021-04-10 NOTE — Progress Notes (Signed)
PROGRESS NOTE    Leslie Sherman  OZD:[id number] DOB: 09-01-1924 DOA: 04/08/2021 PCP: Leola Brazil, DO   Brief Narrative:   [name] is a very pleasant 87 y.o. female with medical history significant for dementia, hypertension, type 2 diabetes mellitus, and history of CVA, now presenting to the emergency department from her ALF for evaluation of increased confusion, lethargy, anorexia, dysuria, and urinary frequency.  Patient admitted for acute metabolic encephalopathy in the setting of likely UTI, she does not meet sepsis criteria at intake.  Of note patient has received 4 immunizations for COVID-19, tested positive on March 05, 2021 and has completed her isolation.   Assessment & Plan:   Acute metabolic encephalopathy, POA  -Presented with worsening confusion lethargy and anorexia  -Daughter Dawnmarie is at bedside indicates patient appears to be back to baseline today, awake alert oriented to person and general situation but otherwise confused about date current affairs and time.   -Continue supportive care, antibiotics, follow for polypharmacy or sedating medications and hold as appropriate -high risk for sundowning/hospital delirium per discussion with family   UTI secondary to E. coli, does NOT meet sepsis criteria -Most likely etiology for increased confusion lethargy dysuria and urinary frequency -Cultures: E. coli, sensitivities pending, continue Rocephin for 3-day course -to finish 04/10/2021; consider 5-day course if mental status or symptoms do not continue to improve over the next 24 hours.  Hypertension  - Continue home medications including Lopressor and hydralazine -somewhat uncontrolled today given patient's noncompliance with p.o. medications from staff although family at bedside today may be able to assist in medication administration  Type II DM, non insulin dependent, well controlled - A1c was 6.2% in May 2022  -Continue low-dose sliding scale, follow  hypoglycemic protocol, p.o. intake improving slightly over the past 24 hours, no episodes of hypoglycemia   Prior CVA  - Continue ASA, Plavix, and statin    Positive COVID test  -Patient previously tested positive on 03/05/2021 completed 3-week quarantine no longer having symptoms, no indication to test patient given high likelihood of false positive, unfortunately patient was swabbed at admission and found to be incidentally positive; likely continued viral shedding of inactive virus status postacute illness and recovery   DVT prophylaxis: Lovenox  Code Status: DNR, confirmed on admission  Family Communication: None present  Status is: In patient  Dispo: The patient is from: Assisted living facility              Anticipated d/c is to: Back to assisted living facility, continue PT OT              Anticipated d/c date is: 24 to 48 hours              Patient currently not medically stable for discharge  Consultants:  None  Procedures:  None  Antimicrobials:  Ceftriaxone x3 days  Subjective: No acute issues or events overnight, patient much more awake alert and oriented to person and general situation today, tolerating breakfast on her own without difficulty; denies nausea vomiting diarrhea constipation headache fevers chills or chest pain.  She does indicate what appears to be suprapubic pain, unclear provocation or alleviating factors but likely secondary to UTI as above  Objective: Vitals:   04/09/21 0959 04/09/21 1222 04/09/21 2126 04/10/21 0559  BP: (!) 141/59 (!) 152/56 (!) 157/57 (!) 166/103  Pulse: 79 88 64 60  Resp:  18 16 18   Temp:  98.1 F (36.7 C) (!) 97.3 F (36.3 C) 98 F (  36.7 C)  TempSrc:  Oral Oral Oral  SpO2:   97% 95%  Weight:      Height:        Intake/Output Summary (Last 24 hours) at 04/10/2021 0707 Last data filed at 04/10/2021 0600 Gross per 24 hour  Intake 1364.89 ml  Output 900 ml  Net 464.89 ml    Filed Weights   04/08/21 1704  Weight:  83.5 kg    Examination:  General:  Pleasantly resting in bed, No acute distress.  Alert to person and place only HEENT:  Normocephalic atraumatic.  Sclerae nonicteric, noninjected.  Extraocular movements intact bilaterally. Neck:  Without mass or deformity.  Trachea is midline. Lungs:  Clear to auscultate bilaterally without rhonchi, wheeze, or rales. Heart:  Regular rate and rhythm.  Without murmurs, rubs, or gallops. Abdomen:  Soft, nontender, nondistended.  Without guarding or rebound. Extremities: Without cyanosis, clubbing, edema, or obvious deformity. Vascular:  Dorsalis pedis and posterior tibial pulses palpable bilaterally. Skin:  Warm and dry, no erythema, no ulcerations.  Data Reviewed: I have personally reviewed following labs and imaging studies  CBC: Recent Labs  Lab 04/08/21 1732 04/09/21 1335  WBC 5.7 5.1  NEUTROABS 3.8 3.3  HGB 12.8 11.6*  HCT 39.9 38.2  MCV 85.8 89.3  PLT 294 224    Basic Metabolic Panel: Recent Labs  Lab 04/08/21 1732 04/09/21 1335  NA 141 142  K 3.6 3.4*  CL 106 112*  CO2 26 23  GLUCOSE 134* 140*  BUN 11 8  CREATININE 0.83 0.67  CALCIUM 9.4 8.7*  MG  --  1.8  PHOS  --  3.0    GFR: Estimated Creatinine Clearance: 44.9 mL/min (by C-G formula based on SCr of 0.67 mg/dL). Liver Function Tests: Recent Labs  Lab 04/08/21 1732 04/09/21 1335  AST 45* 50*  ALT 25 25  ALKPHOS 135* 122  BILITOT 0.7 1.0  PROT 8.2* 7.3  ALBUMIN 4.0 3.6    Recent Labs  Lab 04/08/21 1732  LIPASE 31    No results for input(s): AMMONIA in the last 168 hours. Coagulation Profile: No results for input(s): INR, PROTIME in the last 168 hours. Cardiac Enzymes: No results for input(s): CKTOTAL, CKMB, CKMBINDEX, TROPONINI in the last 168 hours. BNP (last 3 results) No results for input(s): PROBNP in the last 8760 hours. HbA1C: No results for input(s): HGBA1C in the last 72 hours. CBG: Recent Labs  Lab 04/09/21 0013 04/09/21 0755  04/09/21 1120 04/09/21 1642 04/09/21 2129  GLUCAP 125* 103* 90 98 89    Lipid Profile: No results for input(s): CHOL, HDL, LDLCALC, TRIG, CHOLHDL, LDLDIRECT in the last 72 hours. Thyroid Function Tests: No results for input(s): TSH, T4TOTAL, FREET4, T3FREE, THYROIDAB in the last 72 hours. Anemia Panel: No results for input(s): VITAMINB12, FOLATE, FERRITIN, TIBC, IRON, RETICCTPCT in the last 72 hours. Sepsis Labs: Recent Labs  Lab 04/08/21 1732 04/08/21 2311  LATICACIDVEN 2.8* 1.9     Recent Results (from the past 240 hour(s))  Blood culture (routine x 2)     Status: None (Preliminary result)   Collection Time: 04/08/21  5:32 PM   Specimen: BLOOD  Result Value Ref Range Status   Specimen Description   Final    BLOOD RIGHT ANTECUBITAL Performed at [facility], [address] [place], [place] [address]    Special Requests   Final    BOTTLES DRAWN AEROBIC AND ANAEROBIC Blood Culture adequate volume Performed at [facility], [address] [place], [place] [address]    Culture   Final    NO GROWTH < 12 HOURS Performed at [facility], [address] [place], [place] [address]    Report Status PENDING  Incomplete  Blood culture (routine x 2)     Status: None (Preliminary result)   Collection Time: 04/08/21  5:37 PM   Specimen: BLOOD  Result Value Ref Range Status   Specimen Description   Final    BLOOD LEFT ANTECUBITAL Performed at [facility], [address] [place], [place] [address]    Special Requests   Final    BOTTLES DRAWN AEROBIC AND ANAEROBIC Blood Culture adequate volume Performed at [facility], [address] [place], [place] [address]    Culture   Final    NO GROWTH < 12 HOURS Performed at [facility], [address] [place], [place] [address]    Report Status PENDING  Incomplete  Resp Panel by RT-PCR (Flu A&B, Covid)     Status: Abnormal   Collection Time: 04/08/21  7:07 PM  Result  Value Ref Range Status   SARS Coronavirus 2 by RT PCR POSITIVE (A) NEGATIVE Final    Comment: RESULT CALLED TO, READ BACK BY AND VERIFIED WITH:  POWELL,VR RN @1958  04/08/21 EDENSCA (NOTE) SARS-CoV-2 target nucleic acids are DETECTED.  The SARS-CoV-2 RNA is generally detectable in upper respiratory specimens during the acute phase of infection. Positive results are indicative of the presence of the identified virus, but do not rule out bacterial infection or co-infection with other pathogens not detected by the test. Clinical correlation with patient history and other diagnostic information is necessary to determine patient infection status. The expected result is Negative.  Fact Sheet for Patients: 06/08/21  Fact Sheet for Healthcare Providers: BloggerCourse.com  This test is not yet approved or cleared by the [employer] FDA and  has been authorized for detection and/or diagnosis of SARS-CoV-2 by FDA under an Emergency Use Authorization (EUA).  This EUA will remain in effect (meaning this test can  be used) for the duration of  the COVID-19 declaration under Section 564(b)(1) of the Act, 21 U.S.C. section 360bbb-3(b)(1), unless the authorization is terminated or revoked sooner.     Influenza A by PCR NEGATIVE NEGATIVE Final   Influenza B by PCR NEGATIVE NEGATIVE Final    Comment: (NOTE) The Xpert Xpress SARS-CoV-2/FLU/RSV plus assay is intended as an aid in the diagnosis of influenza from Nasopharyngeal swab specimens and should not be used as a sole basis for treatment. Nasal washings and aspirates are unacceptable for Xpert Xpress SARS-CoV-2/FLU/RSV testing.  Fact Sheet for Patients: [place]  Fact Sheet for Healthcare Providers: BloggerCourse.com  This test is not yet approved or cleared by the [employer]ious[employer] FDA and has been authorized for detection  and/or diagnosis of SARS-CoV-2 by FDA under an Emergency Use Authorization (EUA). This EUA will remain in effect (meaning this test can be used) for the duration of the COVID-19 declaration under Section 564(b)(1) of the Act, 21 U.S.C. section 360bbb-3(b)(1), unless the authorization is terminated or revoked.  Performed at [facility], [address], [place], [place]           Radiology Studies: CT Head Wo Contrast  Result Date: 04/08/2021 CLINICAL DATA:  Altered mental status EXAM: CT HEAD WITHOUT CONTRAST TECHNIQUE: Contiguous axial images were obtained from the base of the skull through the  vertex without intravenous contrast. COMPARISON:  01/05/2021 FINDINGS: Brain: Old left basal ganglia lacunar infarct. There is atrophy and chronic small vessel disease changes. No acute intracranial abnormality. Specifically, no hemorrhage, hydrocephalus, mass lesion, acute infarction, or significant intracranial injury. Vascular: No hyperdense vessel or unexpected calcification. Skull: No acute calvarial abnormality. Sinuses/Orbits: Areas of mucosal thickening.  No air-fluid levels. Other: None IMPRESSION: Old left basal ganglia lacunar infarct. Atrophy, chronic microvascular disease. No acute intracranial abnormality. Electronically Signed   By: Charlett Nose M.D.   On: 04/08/2021 18:27   DG Chest Portable 1 View  Result Date: 04/08/2021 CLINICAL DATA:  Altered mental status EXAM: PORTABLE CHEST 1 VIEW COMPARISON:  02/01/2016 FINDINGS: Heart is normal size. Aortic atherosclerosis. No confluent opacities or effusions. No acute bony abnormality. IMPRESSION: No active disease. Electronically Signed   By: Charlett Nose M.D.   On: 04/08/2021 18:28        Scheduled Meds:  aspirin  81 mg Oral Daily   atorvastatin  40 mg Oral QHS   clopidogrel  75 mg Oral Daily   enoxaparin (LOVENOX) injection  40 mg Subcutaneous Q24H   famotidine  20 mg Oral BH-q7a   hydrALAZINE  25 mg Oral  TID   insulin aspart  0-5 Units Subcutaneous QHS   insulin aspart  0-9 Units Subcutaneous TID WC   metoprolol tartrate  50 mg Oral BID   Continuous Infusions:  cefTRIAXone (ROCEPHIN)  IV 1 g (04/09/21 1743)     LOS: 1 day    Time spent:    Azucena Fallen, DO [facility]  If 7PM-7AM, please contact night-coverage [email or web address]  04/10/2021, 7:07 AM

## 2021-04-11 DIAGNOSIS — E119 Type 2 diabetes mellitus without complications: Secondary | ICD-10-CM | POA: Diagnosis not present

## 2021-04-11 DIAGNOSIS — F0390 Unspecified dementia without behavioral disturbance: Secondary | ICD-10-CM | POA: Diagnosis not present

## 2021-04-11 DIAGNOSIS — G9340 Encephalopathy, unspecified: Secondary | ICD-10-CM | POA: Diagnosis not present

## 2021-04-11 DIAGNOSIS — N3000 Acute cystitis without hematuria: Secondary | ICD-10-CM | POA: Diagnosis not present

## 2021-04-11 LAB — GLUCOSE, CAPILLARY
Glucose-Capillary: 119 mg/dL — ABNORMAL HIGH (ref 70–99)
Glucose-Capillary: 98 mg/dL (ref 70–99)
Glucose-Capillary: 117 mg/dL — ABNORMAL HIGH (ref 70–99)

## 2021-04-11 MED ORDER — QUETIAPINE FUMARATE 25 MG PO TABS: 12.5000 mg | ORAL_TABLET | Freq: Every evening | ORAL | Status: DC | PRN

## 2021-04-11 NOTE — Progress Notes (Signed)
Patient refused morning labs. Will attempt again at a later time.

## 2021-04-11 NOTE — Evaluation (Signed)
Occupational Therapy Evaluation Patient Details Name: Leslie Sherman MRN: [id number] DOB: [birth year] Today's Date: 04/11/2021   History of Present Illness Leslie Sherman is a very pleasant 87 y.o. female with medical history significant for dementia, hypertension, type 2 diabetes mellitus, and history of CVA, now presenting to the emergency department from her ALF for evaluation of increased confusion, lethargy, anorexia, dysuria, and urinary frequency.  Patient admitted for acute metabolic encephalopathy in the setting of likely UTI,   Clinical Impression   Leslie Sherman is a 87 year old woman who presents with baseline dementia, generalized weakness, decreased activity tolerance and impaired balance resulting in a decline in functional abilities. Patient has been able to walk with PT at facility with RW but has been primarily using wheelchair. She has required assistance for ADLs due to the dementia but today patient requiring max-total assist for LB dressing and toileting. Patient max assist to pivot to recliner - exhibiting difficulty with powering up, weight shifting and not able to take a functional steps. Patient will benefit from skilled OT services while in hospital to improve deficits and learn compensatory strategies as needed in order to return to PLOF.  Recommend SNF at discharge to maximize functional abilities prior to return to ALF.     Recommendations for follow up therapy are one component of a multi-disciplinary discharge planning process, led by the attending physician.  Recommendations may be updated based on patient status, additional functional criteria and insurance authorization.   Follow Up Recommendations  SNF    Equipment Recommendations  None recommended by OT    Recommendations for Other Services       Precautions / Restrictions Precautions Precautions: Fall Restrictions Weight Bearing Restrictions: No      Mobility Bed Mobility Overal bed mobility:  Needs Assistance Bed Mobility: Supine to Sit     Supine to sit: Max assist;HOB elevated     General bed mobility comments: assistance for LEs and trunk negotiation - use of bed pad to pivot hips and scoot to edge of bed    Transfers Overall transfer level: Needs assistance Equipment used: Rolling walker (2 wheeled) Transfers: Sit to/from Stand Sit to Stand: Mod assist;Max assist;From elevated surface         General transfer comment: Performed sit to stand x 3 for eventual transfer to recliner. Initially mod assist to stand but could not take a step and patient returned to seated position. Max assist to stand on the second stand with RW and patient unable to take a step again. Removed walker and performed a squat pivot to recilner. Significant difficulty getting patient scooted back in the chair as well.    Balance Overall balance assessment: Needs assistance Sitting-balance support: No upper extremity supported;Feet supported Sitting balance-Leahy Scale: Fair     Standing balance support: During functional activity Standing balance-Leahy Scale: Poor Standing balance comment: reliant on external assistance                           ADL either performed or assessed with clinical judgement   ADL Overall ADL's : Needs assistance/impaired Eating/Feeding: Set up;Sitting   Grooming: Set up;Sitting;Wash/dry face   Upper Body Bathing: Sitting;Cueing for sequencing;Minimal assistance   Lower Body Bathing: Maximal assistance;Sit to/from stand;Cueing for sequencing   Upper Body Dressing : Sitting;Minimal assistance;Cueing for sequencing   Lower Body Dressing: +2 for physical assistance;Total assistance;Sit to/from stand   Toilet Transfer: Maximal assistance;+2 for safety/equipment;BSC;Squat-pivot Toilet Transfer Details (indicate  cue type and reason): as demonstrated via transfer to recliner Toileting- Clothing Manipulation and Hygiene: Total assistance Toileting -  Clothing Manipulation Details (indicate cue type and reason): incontinent     Functional mobility during ADLs: Maximal assistance;Rolling walker       Vision Patient Visual Report: No change from baseline       Perception     Praxis      Pertinent Vitals/Pain Pain Assessment: Faces Faces Pain Scale: Hurts a little bit Pain Location: L knee Pain Descriptors / Indicators: Grimacing;Sore Pain Intervention(s): Monitored during session     Hand Dominance Right   Extremity/Trunk Assessment Upper Extremity Assessment Upper Extremity Assessment: Overall WFL for tasks assessed   Lower Extremity Assessment Lower Extremity Assessment: Defer to PT evaluation   Cervical / Trunk Assessment Cervical / Trunk Assessment: Kyphotic   Communication Communication Communication: No difficulties   Cognition Arousal/Alertness: Awake/alert Behavior During Therapy: WFL for tasks assessed/performed Overall Cognitive Status: History of cognitive impairments - at baseline                                 General Comments: Dementia at baseline. Oriented to self - knows she is in the hospital. Able to follow simple commands. Needed assistance of family to encourage patient to participate.   General Comments       Exercises     Shoulder Instructions      Home Living Family/patient expects to be discharged to:: Skilled nursing facility                                        Prior Functioning/Environment Level of Independence: Needs assistance  Gait / Transfers Assistance Needed: predominantly been in a wc recently (had two falls and patient's fear limiting her mobility_ with patient getting HH PT services - could walker with RW with PT. ADL's / Homemaking Assistance Needed: able to feed herself and assist with ADLs - but had assistance with dressing, bathing, toileting. incontinent at baseline Communication / Swallowing Assistance Needed: functinoal           OT Problem List: Decreased activity tolerance;Impaired balance (sitting and/or standing);Decreased cognition;Decreased safety awareness;Decreased knowledge of use of DME or AE;Pain;Obesity      OT Treatment/Interventions: Self-care/ADL training;Therapeutic exercise;DME and/or AE instruction;Therapeutic activities;Balance training;Patient/family education    OT Goals(Current goals can be found in the care plan section) Acute Rehab OT Goals Patient Stated Goal: to walk OT Goal Formulation: With family Time For Goal Achievement: 04/25/21 Potential to Achieve Goals: Good  OT Frequency: Min 2X/week   Barriers to D/C:            Co-evaluation              AM-PAC OT "6 Clicks" Daily Activity     Outcome Measure Help from another person eating meals?: A Little Help from another person taking care of personal grooming?: A Little Help from another person toileting, which includes using toliet, bedpan, or urinal?: Total Help from another person bathing (including washing, rinsing, drying)?: A Lot Help from another person to put on and taking off regular upper body clothing?: A Little Help from another person to put on and taking off regular lower body clothing?: Total 6 Click Score: 13   End of Session Equipment Utilized During Treatment: Rolling walker;Gait belt Nurse Communication: Mobility status  Activity Tolerance: Patient tolerated treatment well Patient left: in chair;with call bell/phone within reach;with chair alarm set;with family/visitor present  OT Visit Diagnosis: Unsteadiness on feet (R26.81);Other abnormalities of gait and mobility (R26.89);Pain;Other symptoms and signs involving cognitive function                Time: 5852-7782 OT Time Calculation (min): 29 min Charges:  OT General Charges $OT Visit: 1 Visit OT Evaluation $OT Eval Low Complexity: 1 Low OT Treatments $Therapeutic Activity: 8-22 mins  Evone Arseneau, OTR/L Acute Care Rehab Services  Office  [phone] Pager: [phone]   Kelli Churn 04/11/2021, 1:05 PM

## 2021-04-11 NOTE — Progress Notes (Signed)
Pt refused morning CBG and morning labs, pt yelled at CNA and is refusing morning medication, MD made aware.

## 2021-04-11 NOTE — Progress Notes (Addendum)
PROGRESS NOTE    Leslie Sherman  ERD:[id number] DOB: 1924-09-01 DOA: 04/08/2021 PCP: Leola Brazil, DO    Chief Complaint  Patient presents with   Altered Mental Status    Brief Narrative:  Leslie Sherman is a very pleasant 87 y.o. female with medical history significant for dementia, hypertension, type 2 diabetes mellitus, and history of CVA, now presenting to the emergency department from her ALF for evaluation of increased confusion, lethargy, anorexia, dysuria, and urinary frequency.  Patient admitted for acute metabolic encephalopathy in the setting of likely UTI, she does not meet sepsis criteria at intake.  Of note patient has received 4 immunizations for COVID-19, tested positive on March 05, 2021 and has completed her isolation.     Assessment & Plan:   Principal Problem:   Acute encephalopathy Active Problems:   UTI (urinary tract infection)   History of ischemic stroke   Dementia (HCC)   Hypertension   Diabetes mellitus type II, non insulin dependent (HCC)   Lab test positive for detection of COVID-19 virus   #1 acute metabolic encephalopathy, POA -Likely secondary to E. coli UTI. -Patient had presented with worsening confusion, lethargy, anorexia. -Urinalysis done concerning for UTI patient on IV Rocephin. -Improving clinically with improvement with confusion however still with significant weakness. -Continue IV antibiotics to complete a 5-day course of treatment. -PT/OT following. -Likely needs SNF on discharge versus back to ALF with home health therapies.  2.  E. coli UTI, sepsis ruled out, POA -Patient presented with worsening confusion lethargic, dysuria, urinary frequency urinalysis concerning for UTI. -Urine cultures positive for E. coli. -Continue IV Rocephin to complete a 5-day course of treatment.  3.  Hypertension -Continue current regimen of hydralazine, Lopressor.  4.  Diabetes mellitus type 2, non-insulin-dependent, well  controlled -Hemoglobin A1c noted to be 6.2 on May 2022. -Hold oral hypoglycemic agents. -CBG at 119 this morning. -SSI.  5.  History of CVA -Stable -Continue aspirin, Plavix, statin. -PT/OT.  6.  Positive COVID test -Patient noted to have previously tested positive on 03/05/2021 completed 3-week quarantine currently asymptomatic. -No indication to test patient given high likelihood of a false positive, unfortunately patient swabbed on admission and found to be incidentally positive. -Concerned that likely viral shedding at the time of inactive virus status post postacute illness and recovery. -Supportive care. -Off airborne and contact precautions.  7.  Dementia -Stable. -Place on Seroquel nightly as needed agitation.  8.  Hypokalemia -Replete.      DVT prophylaxis: Lovenox Code Status: DNR Family Communication: Updated patient, daughter at bedside Disposition:   Status is: Inpatient  Remains inpatient appropriate because:Inpatient level of care appropriate due to severity of illness  Dispo: The patient is from: ALF              Anticipated d/c is to: ALF with home health versus SNF.              Patient currently is not medically stable to d/c.   Difficult to place patient No       Consultants:  None  Procedures:  CT head without contrast 04/08/2021 Chest x-ray 04/08/2021   Antimicrobials:  IV Rocephin 04/08/2021>>>>   Subjective: Sitting up in chair.  Eating pineapple.  Denies any chest pain.  No shortness of breath.  No abdominal pain.  Denies any dysuria.  Daughter at bedside who feels patient is improving.  Daughter stated patient significantly weak likely due to being bedbound during this hospitalization.  Objective: Vitals:  04/10/21 1314 04/10/21 2026 04/11/21 0532 04/11/21 0934  BP: (!) 128/54 (!) 129/47 (!) 157/138 (!) 148/72  Pulse: (!) 54 60 68   Resp: 18 18 18    Temp: 98.2 F (36.8 C) 98.8 F (37.1 C)    TempSrc: Oral Oral    SpO2: 99%  97%    Weight:      Height:        Intake/Output Summary (Last 24 hours) at 04/11/2021 1200 Last data filed at 04/11/2021 1000 Gross per 24 hour  Intake 240 ml  Output 100 ml  Net 140 ml   Filed Weights   04/08/21 1704  Weight: 83.5 kg    Examination:  General exam: Appears calm and comfortable  Respiratory system: Clear to auscultation. Respiratory effort normal. Cardiovascular system: S1 & S2 heard, RRR. No JVD, murmurs, rubs, gallops or clicks. No pedal edema. Gastrointestinal system: Abdomen is nondistended, soft and nontender. No organomegaly or masses felt. Normal bowel sounds heard. Central nervous system: Alert and oriented. No focal neurological deficits. Extremities: Symmetric 5 x 5 power. Skin: No rashes, lesions or ulcers Psychiatry: Judgement and insight appear poor to fair. Mood & affect appropriate.     Data Reviewed: I have personally reviewed following labs and imaging studies  CBC: Recent Labs  Lab 04/08/21 1732 04/09/21 1335 04/10/21 0824  WBC 5.7 5.1 4.3  NEUTROABS 3.8 3.3  --   HGB 12.8 11.6* 10.7*  HCT 39.9 38.2 33.1*  MCV 85.8 89.3 86.2  PLT 294 224 221    Basic Metabolic Panel: Recent Labs  Lab 04/08/21 1732 04/09/21 1335 04/10/21 0824  NA 141 142 142  K 3.6 3.4* 3.4*  CL 106 112* 112*  CO2 26 23 25   GLUCOSE 134* 140* 95  BUN 11 8 8   CREATININE 0.83 0.67 0.61  CALCIUM 9.4 8.7* 8.5*  MG  --  1.8  --   PHOS  --  3.0  --     GFR: Estimated Creatinine Clearance: 44.9 mL/min (by C-G formula based on SCr of 0.61 mg/dL).  Liver Function Tests: Recent Labs  Lab 04/08/21 1732 04/09/21 1335  AST 45* 50*  ALT 25 25  ALKPHOS 135* 122  BILITOT 0.7 1.0  PROT 8.2* 7.3  ALBUMIN 4.0 3.6    CBG: Recent Labs  Lab 04/09/21 2129 04/10/21 0739 04/10/21 1146 04/10/21 1709 04/10/21 2057  GLUCAP 89 96 98 95 103*     Recent Results (from the past 240 hour(s))  Blood culture (routine x 2)     Status: None (Preliminary result)    Collection Time: 04/08/21  5:32 PM   Specimen: BLOOD  Result Value Ref Range Status   Specimen Description   Final    BLOOD RIGHT ANTECUBITAL Performed at [facility], [address] [place], [facility] [address]    Special Requests   Final    BOTTLES DRAWN AEROBIC AND ANAEROBIC Blood Culture adequate volume Performed at [facility], [address] [place], [facility] [address]    Culture   Final    NO GROWTH 3 DAYS Performed at [facility], [address], [place], [facility] [address]    Report Status PENDING  Incomplete  Blood culture (routine x 2)     Status: None (Preliminary result)   Collection Time: 04/08/21  5:37 PM   Specimen: BLOOD  Result Value Ref Range Status   Specimen Description   Final    BLOOD LEFT ANTECUBITAL Performed at  [facility], [address] [place], [place] [address]    Special Requests   Final    BOTTLES DRAWN AEROBIC AND ANAEROBIC Blood Culture adequate volume Performed at [facility], [address] [place], [place] [address]    Culture   Final    NO GROWTH 3 DAYS Performed at [facility], [address] [place], [place] [address]    Report Status PENDING  Incomplete  Urine Culture     Status: Abnormal (Preliminary result)   Collection Time: 04/08/21  5:45 PM   Specimen: Urine, Random  Result Value Ref Range Status   Specimen Description   Final    URINE, RANDOM Performed at [facility], [address] [place] [address] [place], [place] [address]    Special Requests   Final    NONE Performed at [facility], [address] [place], [place] [address]    Culture (A)  Final    >=100,000 COLONIES/mL ESCHERICHIA COLI CULTURE REINCUBATED FOR BETTER GROWTH Performed at [facility], [address] [place], [place] [address]    Report Status PENDING  Incomplete   Organism ID, Bacteria ESCHERICHIA COLI (A)  Final      Susceptibility   Escherichia coli - MIC*     AMPICILLIN <=2 SENSITIVE Sensitive     CEFAZOLIN <=4 SENSITIVE Sensitive     CEFEPIME <=0.12 SENSITIVE Sensitive     CEFTRIAXONE <=0.25 SENSITIVE Sensitive     CIPROFLOXACIN <=0.25 SENSITIVE Sensitive     GENTAMICIN <=1 SENSITIVE Sensitive     IMIPENEM <=0.25 SENSITIVE Sensitive     NITROFURANTOIN <=16 SENSITIVE Sensitive     TRIMETH/SULFA <=20 SENSITIVE Sensitive     AMPICILLIN/SULBACTAM <=2 SENSITIVE Sensitive     PIP/TAZO <=4 SENSITIVE Sensitive     * >=100,000 COLONIES/mL ESCHERICHIA COLI  Resp Panel by RT-PCR (Flu A&B, Covid)     Status: Abnormal   Collection Time: 04/08/21  7:07 PM  Result Value Ref Range Status   SARS Coronavirus 2 by RT PCR POSITIVE (A) NEGATIVE Final    Comment: RESULT CALLED TO, READ BACK BY AND VERIFIED WITH:  POWELL,VR RN @1958  04/08/21 EDENSCA (NOTE) SARS-CoV-2 target nucleic acids are DETECTED.  The SARS-CoV-2 RNA is generally detectable in upper respiratory specimens during the acute phase of infection. Positive results are indicative of the presence of the identified virus, but do not rule out bacterial infection or co-infection with other pathogens not detected by the test. Clinical correlation with patient history and other diagnostic information is necessary to determine patient infection status. The expected result is Negative.  Fact Sheet for Patients: 06/08/21  Fact Sheet for Healthcare Providers: BloggerCourse.com  This test is not yet approved or cleared by the [place] FDA and  has been authorized for detection and/or diagnosis of SARS-CoV-2 by FDA under an Emergency Use Authorization (EUA).  This EUA will remain in effect (meaning this test can  be used) for the duration of  the COVID-19 declaration under Section 564(b)(1) of the Act, 21 U.S.C. section 360bbb-3(b)(1), unless the authorization is terminated or revoked sooner.     Influenza A by PCR NEGATIVE NEGATIVE Final    Influenza B by PCR NEGATIVE NEGATIVE Final    Comment: (NOTE) The Xpert Xpress SARS-CoV-2/FLU/RSV plus assay is intended as an aid in the diagnosis of influenza from Nasopharyngeal swab specimens and should not be used as a sole basis for treatment. Nasal washings and  aspirates are unacceptable for Xpert Xpress SARS-CoV-2/FLU/RSV testing.  Fact Sheet for Patients: BloggerCourse.com  Fact Sheet for Healthcare Providers: SeriousBroker.it  This test is not yet approved or cleared by the [place] FDA and has been authorized for detection and/or diagnosis of SARS-CoV-2 by FDA under an Emergency Use Authorization (EUA). This EUA will remain in effect (meaning this test can be used) for the duration of the COVID-19 declaration under Section 564(b)(1) of the Act, 21 U.S.C. section 360bbb-3(b)(1), unless the authorization is terminated or revoked.  Performed at [facility], [address], [place], [place] [address]          Radiology Studies: No results found.      Scheduled Meds:  aspirin  81 mg Oral Daily   atorvastatin  40 mg Oral QHS   clopidogrel  75 mg Oral Daily   enoxaparin (LOVENOX) injection  40 mg Subcutaneous Q24H   famotidine  20 mg Oral BH-q7a   hydrALAZINE  25 mg Oral TID   insulin aspart  0-5 Units Subcutaneous QHS   insulin aspart  0-9 Units Subcutaneous TID WC   metoprolol tartrate  50 mg Oral BID   Continuous Infusions:  cefTRIAXone (ROCEPHIN)  IV 1 g (04/10/21 1818)     LOS: 2 days    Time spent: 35 minutes    Ramiro Harvest, MD [facility]   To contact the attending provider between 7A-7P or the covering provider during after hours 7P-7A, please log into the web site [email or web address] and access using universal [place] password for that web site. If you do not have the password, please call the hospital operator.  04/11/2021, 12:00 PM

## 2021-04-12 DIAGNOSIS — F0390 Unspecified dementia without behavioral disturbance: Secondary | ICD-10-CM | POA: Diagnosis not present

## 2021-04-12 DIAGNOSIS — G9340 Encephalopathy, unspecified: Secondary | ICD-10-CM | POA: Diagnosis not present

## 2021-04-12 DIAGNOSIS — E119 Type 2 diabetes mellitus without complications: Secondary | ICD-10-CM | POA: Diagnosis not present

## 2021-04-12 DIAGNOSIS — N3000 Acute cystitis without hematuria: Secondary | ICD-10-CM | POA: Diagnosis not present

## 2021-04-12 LAB — BASIC METABOLIC PANEL
Anion gap: 8 (ref 5–15)
BUN: 8 mg/dL (ref 8–23)
CO2: 23 mmol/L (ref 22–32)
Calcium: 8.9 mg/dL (ref 8.9–10.3)
Chloride: 115 mmol/L — ABNORMAL HIGH (ref 98–111)
Creatinine, Ser: 0.55 mg/dL (ref 0.44–1.00)
GFR, Estimated: >60 mL/min (ref >60)
Glucose, Bld: 103 mg/dL — ABNORMAL HIGH (ref 70–99)
Potassium: 3.5 mmol/L (ref 3.5–5.1)
Sodium: 146 mmol/L — ABNORMAL HIGH (ref 135–145)

## 2021-04-12 LAB — GLUCOSE, CAPILLARY
Glucose-Capillary: 103 mg/dL — ABNORMAL HIGH (ref 70–99)
Glucose-Capillary: 114 mg/dL — ABNORMAL HIGH (ref 70–99)
Glucose-Capillary: 90 mg/dL (ref 70–99)
Glucose-Capillary: 137 mg/dL — ABNORMAL HIGH (ref 70–99)

## 2021-04-12 LAB — CBC
HCT: 35.9 % — ABNORMAL LOW (ref 36.0–46.0)
Hemoglobin: 11.2 g/dL — ABNORMAL LOW (ref 12.0–15.0)
MCH: 27.3 pg (ref 26.0–34.0)
MCHC: 31.2 g/dL (ref 30.0–36.0)
MCV: 87.6 fL (ref 80.0–100.0)
Platelets: 198 10*3/uL (ref 150–400)
RBC: 4.1 MIL/uL (ref 3.87–5.11)
RDW: 17.1 % — ABNORMAL HIGH (ref 11.5–15.5)
WBC: 4.7 10*3/uL (ref 4.0–10.5)
nRBC: 0 % (ref 0.0–0.2)

## 2021-04-12 LAB — URINE CULTURE: Culture: >=100000 — AB

## 2021-04-12 LAB — MAGNESIUM: Magnesium: 2 mg/dL (ref 1.7–2.4)

## 2021-04-12 NOTE — Evaluation (Signed)
Physical Therapy Evaluation Patient Details Name: Leslie Sherman MRN: [id number] DOB: 11-04-1924 Today's Date: 04/12/2021  History of Present Illness  Leslie Sherman is a very pleasant 87 y.o. female with medical history significant for dementia, hypertension, type 2 diabetes mellitus, and history of CVA, now presenting to the emergency department from her ALF for evaluation of increased confusion, lethargy, anorexia, dysuria, and urinary frequency.  Patient admitted for acute metabolic encephalopathy in the setting of likely UTI,   Clinical Impression  Pt presents with generalized weakness, impaired balance with history of falls, difficulty performing mobility tasks, and decreased activity tolerance vs baseline. Pt to benefit from acute PT to address deficits. Pt currently requiring mod-max assist for bed mobility and transfer to standing, transfer to recliner deferred today due to pt fear of falling. PT recommending return to ALF with increased physical assist, otherwise will need ST-SNF to return to PLOF. PT to progress mobility as tolerated, and will continue to follow acutely.         Recommendations for follow up therapy are one component of a multi-disciplinary discharge planning process, led by the attending physician.  Recommendations may be updated based on patient status, additional functional criteria and insurance authorization.  Follow Up Recommendations Home health PT (if ALF can provide increased physical assist; otherwise pt will need ST-SNF)    Equipment Recommendations  None recommended by PT    Recommendations for Other Services       Precautions / Restrictions Precautions Precautions: Fall Restrictions Weight Bearing Restrictions: No      Mobility  Bed Mobility Overal bed mobility: Needs Assistance Bed Mobility: Supine to Sit;Sit to Supine     Supine to sit: Mod assist Sit to supine: Max assist   General bed mobility comments: mod assist for translation to  EOB for trunk and LE management, use of bed pad to scoot hips to EOB and PT supporting trunk to help pt remain upright. Max assist for return to supine, pt fatigued.    Transfers Overall transfer level: Needs assistance Equipment used: Rolling walker (2 wheeled) Transfers: Sit to/from Stand Sit to Stand: Mod assist;From elevated surface         General transfer comment: mod assist for power up, rise, steady. STS x3, two successful stands with tolerance for standing x10 seconds only before fatiguing.  Ambulation/Gait                Stairs            Wheelchair Mobility    Modified Rankin (Stroke Patients Only)       Balance Overall balance assessment: Needs assistance Sitting-balance support: No upper extremity supported;Feet supported Sitting balance-Leahy Scale: Fair Sitting balance - Comments: preference for posterior leaning Postural control: Posterior lean Standing balance support: During functional activity Standing balance-Leahy Scale: Poor Standing balance comment: reliant on external assistance                             Pertinent Vitals/Pain Pain Assessment: Faces Faces Pain Scale: Hurts little more Pain Location: bilat knees L>R Pain Descriptors / Indicators: Grimacing;Sore;Guarding Pain Intervention(s): Limited activity within patient's tolerance;Monitored during session;Repositioned    Home Living Family/patient expects to be discharged to:: Assisted living               Home Equipment: Walker - 2 wheels;Wheelchair - manual      Prior Function Level of Independence: Needs assistance   Gait / Transfers Assistance Needed:  pt reports she has been walking with a RW "a little bit" with PT, otherwise is w/c level.  ADL's / Homemaking Assistance Needed: assist with dressing, bathing, toileting at baseline        Hand Dominance   Dominant Hand: Right    Extremity/Trunk Assessment   Upper Extremity Assessment Upper  Extremity Assessment: Defer to OT evaluation    Lower Extremity Assessment Lower Extremity Assessment: Generalized weakness (motion limited by chronic knee pain; + contraction knee flexors,extensors, hip flexors, DF/PF)    Cervical / Trunk Assessment Cervical / Trunk Assessment: Kyphotic  Communication   Communication: No difficulties  Cognition Arousal/Alertness: Awake/alert Behavior During Therapy: WFL for tasks assessed/performed Overall Cognitive Status: History of cognitive impairments - at baseline                                 General Comments: baseline dementia, oriented to self and hospital. Follows one-step commands consistently, very pleasant, fearful of falling      General Comments      Exercises     Assessment/Plan    PT Assessment Patient needs continued PT services  PT Problem List Decreased strength;Decreased mobility;Decreased safety awareness;Decreased activity tolerance;Decreased balance;Decreased knowledge of use of DME;Pain;Decreased knowledge of precautions;Decreased cognition       PT Treatment Interventions DME instruction;Therapeutic activities;Gait training;Therapeutic exercise;Patient/family education;Balance training;Functional mobility training;Neuromuscular re-education    PT Goals (Current goals can be found in the Care Plan section)  Acute Rehab PT Goals PT Goal Formulation: With patient Time For Goal Achievement: 04/26/21 Potential to Achieve Goals: Good    Frequency Min 3X/week   Barriers to discharge        Co-evaluation               AM-PAC PT "6 Clicks" Mobility  Outcome Measure Help needed turning from your back to your side while in a flat bed without using bedrails?: A Little Help needed moving from lying on your back to sitting on the side of a flat bed without using bedrails?: A Lot Help needed moving to and from a bed to a chair (including a wheelchair)?: A Lot Help needed standing up from a chair  using your arms (e.g., wheelchair or bedside chair)?: A Lot Help needed to walk in hospital room?: A Lot Help needed climbing 3-5 steps with a railing? : Total 6 Click Score: 12    End of Session Equipment Utilized During Treatment: Gait belt Activity Tolerance: Patient tolerated treatment well Patient left: in bed;with call bell/phone within reach;with bed alarm set Nurse Communication: Mobility status PT Visit Diagnosis: Other abnormalities of gait and mobility (R26.89);Muscle weakness (generalized) (M62.81);Unsteadiness on feet (R26.81)    Time: 3614-4315 PT Time Calculation (min) (ACUTE ONLY): 19 min   Charges:   PT Evaluation $PT Eval Low Complexity: 1 Low          Anjalee Cope S, PT DPT Acute Rehabilitation Services Pager [phone]  Office [phone]   Cagney Steenson E Christain [place] 04/12/2021, 9:51 AM

## 2021-04-12 NOTE — [place] FL2 (Signed)
[place] MEDICAID FL2 LEVEL OF CARE SCREENING TOOL     IDENTIFICATION  Patient Name: Leslie Sherman Birthdate: Sep 22, 1924 Sex: female Admission Date (Current Location): 04/08/2021  [facility] and [place] Number:  Producer, television/film/video and Address:  [facility],  [address] [place], [place] [address]      Provider Number: [id number]  Attending Physician Name and Address:  Rodolph Bong, MD  Relative Name and Phone Number:  daughter, Jaleeya Mcnelly [phone]    Current Level of Care: SNF Recommended Level of Care: Skilled Nursing Facility Prior Approval Number:    Date Approved/Denied:   PASRR Number: pending ([place] Must system down currently)  Discharge Plan: SNF    Current Diagnoses: Patient Active Problem List   Diagnosis Date Noted   UTI (urinary tract infection) 04/08/2021   Lab test positive for detection of COVID-19 virus 04/08/2021   Acute encephalopathy    History of ischemic stroke    Dementia (HCC)    Hypertension    Diabetes mellitus type II, non insulin dependent (HCC)     Orientation RESPIRATION BLADDER Height & Weight     Self, Situation  Normal Incontinent, External catheter (currently with purewick; using adult diaper at ALF) Weight: 184 lb 1.4 oz (83.5 kg) Height:  5\' 4"  (162.6 cm)  BEHAVIORAL SYMPTOMS/MOOD NEUROLOGICAL BOWEL NUTRITION STATUS      Continent    AMBULATORY STATUS COMMUNICATION OF NEEDS Skin   Extensive Assist Verbally Normal                       Personal Care Assistance Level of Assistance  Bathing, Feeding, Dressing Bathing Assistance: Limited assistance Feeding assistance: Limited assistance Dressing Assistance: Limited assistance     Functional Limitations Info             SPECIAL CARE FACTORS FREQUENCY  PT (By licensed PT), OT (By licensed OT)     PT Frequency: 5x/wk OT Frequency: 5x/wk            Contractures Contractures Info: Not present    Additional Factors Info  Code Status,  Allergies Code Status Info: DNR Allergies Info: NKDA           Current Medications (04/12/2021):  This is the current hospital active medication list Current Facility-Administered Medications  Medication Dose Route Frequency Provider Last Rate Last Admin   acetaminophen (TYLENOL) tablet 650 mg  650 mg Oral Q6H PRN Opyd, 04/14/2021, MD   650 mg at 04/11/21 1946   aspirin chewable tablet 81 mg  81 mg Oral Daily Opyd, 06/11/21, MD   81 mg at 04/12/21 1058   atorvastatin (LIPITOR) tablet 40 mg  40 mg Oral QHS Opyd, 04/14/21, MD   40 mg at 04/11/21 2147   cefTRIAXone (ROCEPHIN) 1 g in sodium chloride 0.9 % 100 mL IVPB  1 g Intravenous Q24H Opyd, 2148, MD 200 mL/hr at 04/12/21 0106 Infusion Verify at 04/12/21 0106   clopidogrel (PLAVIX) tablet 75 mg  75 mg Oral Daily Opyd, 04/14/21, MD   75 mg at 04/12/21 1058   enoxaparin (LOVENOX) injection 40 mg  40 mg Subcutaneous Q24H Opyd, 04/14/21, MD   40 mg at 04/12/21 0520   famotidine (PEPCID) tablet 20 mg  20 mg Oral BH-q7a Opyd, 11-12-1991, MD   20 mg at 04/12/21 1058   guaiFENesin-dextromethorphan (ROBITUSSIN DM) 100-10 MG/5ML syrup 10 mL  10 mL Oral Q4H PRN Opyd, 04/14/21, MD  hydrALAZINE (APRESOLINE) tablet 25 mg  25 mg Oral TID Opyd, Lavone Neri, MD   25 mg at 04/12/21 1058   insulin aspart (novoLOG) injection 0-5 Units  0-5 Units Subcutaneous QHS Opyd, Lavone Neri, MD       insulin aspart (novoLOG) injection 0-9 Units  0-9 Units Subcutaneous TID WC Opyd, Lavone Neri, MD       metoprolol tartrate (LOPRESSOR) tablet 50 mg  50 mg Oral BID Opyd, Lavone Neri, MD   50 mg at 04/12/21 1058   ondansetron (ZOFRAN) tablet 4 mg  4 mg Oral Q6H PRN Opyd, Lavone Neri, MD       Or   ondansetron (ZOFRAN) injection 4 mg  4 mg Intravenous Q6H PRN Opyd, Lavone Neri, MD       QUEtiapine (SEROQUEL) tablet 12.5 mg  12.5 mg Oral QHS PRN Rodolph Bong, MD       senna-docusate (Senokot-S) tablet 1 tablet  1 tablet Oral QHS PRN Opyd, Lavone Neri, MD          Discharge Medications: Please see discharge summary for a list of discharge medications.  Relevant Imaging Results:  Relevant Lab Results:   Additional Information per MD, INITIAL POSITIVE COVID 9/5. She HAS COMPLETED isolation. Test shouldn't have been repeated on admission. She's not on isolation here. - pt is COVID vaccinated x 4.  Kapil Petropoulos, LCSW

## 2021-04-12 NOTE — TOC Initial Note (Signed)
Transition of Care [facility]) - Initial/Assessment Note    Patient Details  Name: Leslie Sherman MRN: [id number] Date of Birth: [birth year]  Transition of Care [facility]) CM/SW Contact:    Leslie Jupiter, LCSW Phone Number: 04/12/2021, 1:12 PM  Clinical Narrative:                 Received TOC referral to assist with dc planning and probable SNF.  Attempted to speak with pt, however, very lethargic and could not engage.  Contacted pt's daughter, Leslie Sherman, who reports that pt has been a resident at [facility] [place] for ~ 9 yrs.  She notes that her prior functional level was able to perform transfers from wc to chair and would ambulate short distances with therapies.  She confirms that she is NOT currently at her baseline functioning.  Daughter agreeable for me to contact the ALF and discuss pt return.  Have spoken with Leslie Sherman at [facility] [phone]) and reviewed therapy notes.  She does not feel pt is at a functional level that she could return to ALF and feels SNF for rehab is best option - with hopes of returning to ALF afterwards.  Daughter is aware and agreeable with plan for SNF rehab.  Will begin bed search process.  Expected Discharge Plan: Skilled Nursing Facility Barriers to Discharge: Continued Medical Work up, SNF Pending bed offer   Patient Goals and CMS Choice Patient states their goals for this hospitalization and ongoing recovery are:: daughter hopeful she will be able to return to ALD following rehab      Expected Discharge Plan and Services Expected Discharge Plan: Skilled Nursing Facility In-house Referral: Clinical Social Work     Living arrangements for the past 2 months: Assisted Living Facility [facility])                 DME Arranged: N/A DME Agency: NA                  Prior Living Arrangements/Services Living arrangements for the past 2 months: Assisted Living Facility ([facility]) Lives with:: Facility Resident Patient language and need for  interpreter reviewed:: Yes Do you feel safe going back to the place where you live?: Yes      Need for Family Participation in Patient Care: No (Comment) Care giver support system in place?: Yes (comment)   Criminal Activity/Legal Involvement Pertinent to Current Situation/Hospitalization: No - Comment as needed  Activities of Daily Living Home Assistive Devices/Equipment: Environmental consultant (specify type) ADL Screening (condition at time of admission) Patient's cognitive ability adequate to safely complete daily activities?: Yes Is the patient deaf or have difficulty hearing?: No Does the patient have difficulty seeing, even when wearing glasses/contacts?: No Does the patient have difficulty concentrating, remembering, or making decisions?: Yes Patient able to express need for assistance with ADLs?: Yes Does the patient have difficulty dressing or bathing?: No Independently performs ADLs?: Yes (appropriate for developmental age) Does the patient have difficulty walking or climbing stairs?: Yes Weakness of Legs: Both Weakness of Arms/Hands: Both  Permission Sought/Granted Permission sought to share information with : Family Supports Permission granted to share information with : Yes, Verbal Permission Granted  Share Information with NAME: Leslie Sherman     Permission granted to share info w Relationship: daughter  Permission granted to share info w Contact Information: [phone]  Emotional Assessment Appearance:: Appears stated age Attitude/Demeanor/Rapport: Lethargic Affect (typically observed): Unable to Assess Orientation: : Oriented to Self   Psych Involvement: No (comment)  Admission  diagnosis:  UTI (urinary tract infection) [N39.0] Acute cystitis without hematuria [N30.00] Altered mental status, unspecified altered mental status type [R41.82] Patient Active Problem List   Diagnosis Date Noted   UTI (urinary tract infection) 04/08/2021   Lab test positive for detection of  COVID-19 virus 04/08/2021   Acute encephalopathy    History of ischemic stroke    Dementia (HCC)    Hypertension    Diabetes mellitus type II, non insulin dependent (HCC)    PCP:  Leslie Brazil, DO Pharmacy:   Leslie [place], [place] - [address] [place] [address] Phone: [phone] Fax: [phone]     Social Determinants of Health (SDOH) Interventions    Readmission Risk Interventions No flowsheet data found.

## 2021-04-12 NOTE — Progress Notes (Signed)
PROGRESS NOTE    Leslie Sherman  KYH:[id number] DOB: [birth year] DOA: 04/08/2021 PCP: Leola Brazil, DO    Chief Complaint  Patient presents with   Altered Mental Status    Brief Narrative:  Leslie Sherman is a very pleasant 87 y.o. female with medical history significant for dementia, hypertension, type 2 diabetes mellitus, and history of CVA, now presenting to the emergency department from her ALF for evaluation of increased confusion, lethargy, anorexia, dysuria, and urinary frequency.  Patient admitted for acute metabolic encephalopathy in the setting of likely UTI, she does not meet sepsis criteria at intake.  Of note patient has received 4 immunizations for COVID-19, tested positive on March 05, 2021 and has completed her isolation.     Assessment & Plan:   Principal Problem:   Acute encephalopathy Active Problems:   UTI (urinary tract infection)   History of ischemic stroke   Dementia (HCC)   Hypertension   Diabetes mellitus type II, non insulin dependent (HCC)   Lab test positive for detection of COVID-19 virus   1 acute metabolic encephalopathy, POA -Likely secondary to E. coli UTI. -Patient had presented with worsening confusion, lethargy, anorexia. -Urinalysis done concerning for UTI patient on IV Rocephin. -Improving clinically with improvement with confusion however still with significant weakness. -Continue IV antibiotics to complete a 5-day course of treatment. -PT/OT following and recommending SNF placement. -TOC consulted and following.  2.  E. coli UTI, sepsis ruled out, POA -Patient presented with worsening confusion lethargic, dysuria, urinary frequency urinalysis concerning for UTI. -Urine cultures positive for E. coli. -Continue IV Rocephin through today to complete a 5-day course of treatment.  3.  Hypertension -Controlled on current regimen of Lopressor, hydralazine.    4.  Diabetes mellitus type 2, non-insulin-dependent, well  controlled -Hemoglobin A1c noted to be 6.2 on May 2022. -CBG 103. -Continue to hold oral hypoglycemic agents.  -SSI.  5.  History of CVA -Stable -Continue Plavix, aspirin, statin.   -PT OT following.    6.  Positive COVID test -Patient noted to have previously tested positive on 03/05/2021 completed 3-week quarantine currently asymptomatic. -No indication to test patient given high likelihood of a false positive, unfortunately patient swabbed on admission and found to be incidentally positive. -Concerned that likely viral shedding at the time of inactive virus status post postacute illness and recovery. -Supportive care. -Off airborne and contact precautions. -No further testing recommended per  7.  Dementia -Stable. -Seroquel nightly as needed agitation.  8.  Hypokalemia -Potassium at 3.5 -Repeat labs in a.m.      DVT prophylaxis: Lovenox Code Status: DNR Family Communication: Updated patient, daughter at bedside Disposition:   Status is: Inpatient  Remains inpatient appropriate because:Inpatient level of care appropriate due to severity of illness  Dispo: The patient is from: ALF              Anticipated d/c is to: SNF.              Patient currently is not medically stable to d/c.   Difficult to place patient No       Consultants:  None  Procedures:  CT head without contrast 04/08/2021 Chest x-ray 04/08/2021   Antimicrobials:  IV Rocephin 04/08/2021>>>>   Subjective: Patient sleeping but easily arousable.  Denies any chest pain.  No shortness of breath.  No abdominal pain.  Denies any joint pain.  No bleeding.  Per nurse tech patient ate about 40% of her breakfast.   Objective: Vitals:  04/11/21 1310 04/11/21 2139 04/12/21 0530 04/12/21 1006  BP: (!) 131/51 (!) 143/77 (!) 145/67 (!) 160/66  Pulse: (!) 56 65 (!) 57 60  Resp: 18 18 18 18   Temp: (!) 97.4 F (36.3 C) (!) 97.5 F (36.4 C) (!) 97.5 F (36.4 C) 98.5 F (36.9 C)  TempSrc: Oral Oral  Oral Oral  SpO2: 100% 96% 95% 100%  Weight:      Height:        Intake/Output Summary (Last 24 hours) at 04/12/2021 1047 Last data filed at 04/12/2021 1008 Gross per 24 hour  Intake 1405.96 ml  Output 400 ml  Net 1005.96 ml    Filed Weights   04/08/21 1704  Weight: 83.5 kg    Examination:  General exam: : NAD Respiratory system: CTAB.  No wheezes, no rhonchi.  Speaking in full sentences.  Normal respiratory effort. Cardiovascular system: Regular rate and rhythm no murmurs rubs or gallops.  No JVD.  No lower extremity edema.  Gastrointestinal system: Abdomen soft, nontender, nondistended, positive bowel sounds.  No rebound.  No guarding. Central nervous system: Alert and oriented. No focal neurological deficits. Extremities: Symmetric 5 x 5 power. Skin: No rashes, lesions or ulcers Psychiatry: Judgement and insight appear poor to fair. Mood & affect appropriate.   Data Reviewed: I have personally reviewed following labs and imaging studies  CBC: Recent Labs  Lab 04/08/21 1732 04/09/21 1335 04/10/21 0824 04/12/21 0811  WBC 5.7 5.1 4.3 4.7  NEUTROABS 3.8 3.3  --   --   HGB 12.8 11.6* 10.7* 11.2*  HCT 39.9 38.2 33.1* 35.9*  MCV 85.8 89.3 86.2 87.6  PLT 294 224 221 198     Basic Metabolic Panel: Recent Labs  Lab 04/08/21 1732 04/09/21 1335 04/10/21 0824 04/12/21 0811  NA 141 142 142 146*  K 3.6 3.4* 3.4* 3.5  CL 106 112* 112* 115*  CO2 26 23 25 23   GLUCOSE 134* 140* 95 103*  BUN 11 8 8 8   CREATININE 0.83 0.67 0.61 0.55  CALCIUM 9.4 8.7* 8.5* 8.9  MG  --  1.8  --  2.0  PHOS  --  3.0  --   --      GFR: Estimated Creatinine Clearance: 44.9 mL/min (by C-G formula based on SCr of 0.55 mg/dL).  Liver Function Tests: Recent Labs  Lab 04/08/21 1732 04/09/21 1335  AST 45* 50*  ALT 25 25  ALKPHOS 135* 122  BILITOT 0.7 1.0  PROT 8.2* 7.3  ALBUMIN 4.0 3.6     CBG: Recent Labs  Lab 04/10/21 2057 04/11/21 1207 04/11/21 1657 04/11/21 2151  04/12/21 0730  GLUCAP 103* 119* 98 117* 103*      Recent Results (from the past 240 hour(s))  Blood culture (routine x 2)     Status: None (Preliminary result)   Collection Time: 04/08/21  5:32 PM   Specimen: BLOOD  Result Value Ref Range Status   Specimen Description   Final    BLOOD RIGHT ANTECUBITAL Performed at [facility], [address] [place] [address] [place], [facility] [address]    Special Requests   Final    BOTTLES DRAWN AEROBIC AND ANAEROBIC Blood Culture adequate volume Performed at [facility], [address] [place], [facility] [address]    Culture   Final    NO GROWTH 4 DAYS Performed at [facility], [address], [employer], [facility] [address]    Report Status PENDING  Incomplete  Blood culture (  routine x 2)     Status: None (Preliminary result)   Collection Time: 04/08/21  5:37 PM   Specimen: BLOOD  Result Value Ref Range Status   Specimen Description   Final    BLOOD LEFT ANTECUBITAL Performed at [facility], [address] [place], [place] [address]    Special Requests   Final    BOTTLES DRAWN AEROBIC AND ANAEROBIC Blood Culture adequate volume Performed at [facility], [address] [place], [place] [address]    Culture   Final    NO GROWTH 4 DAYS Performed at [facility], [address] [place], [place] [address]    Report Status PENDING  Incomplete  Urine Culture     Status: Abnormal   Collection Time: 04/08/21  5:45 PM   Specimen: Urine, Random  Result Value Ref Range Status   Specimen Description   Final    URINE, RANDOM Performed at [facility], [address] [place], [place] [address]    Special Requests   Final    NONE Performed at [facility], [address] [place], [place] [address]    Culture (A)  Final    >=100,000 COLONIES/mL ESCHERICHIA COLI >=100,000 COLONIES/mL AEROCOCCUS URINAE Standardized susceptibility testing for this organism is not  available. Performed at [facility], [address], [place], [place] [address]    Report Status 04/12/2021 FINAL  Final   Organism ID, Bacteria ESCHERICHIA COLI (A)  Final      Susceptibility   Escherichia coli - MIC*    AMPICILLIN <=2 SENSITIVE Sensitive     CEFAZOLIN <=4 SENSITIVE Sensitive     CEFEPIME <=0.12 SENSITIVE Sensitive     CEFTRIAXONE <=0.25 SENSITIVE Sensitive     CIPROFLOXACIN <=0.25 SENSITIVE Sensitive     GENTAMICIN <=1 SENSITIVE Sensitive     IMIPENEM <=0.25 SENSITIVE Sensitive     NITROFURANTOIN <=16 SENSITIVE Sensitive     TRIMETH/SULFA <=20 SENSITIVE Sensitive     AMPICILLIN/SULBACTAM <=2 SENSITIVE Sensitive     PIP/TAZO <=4 SENSITIVE Sensitive     * >=100,000 COLONIES/mL ESCHERICHIA COLI  Resp Panel by RT-PCR (Flu A&B, Covid)     Status: Abnormal   Collection Time: 04/08/21  7:07 PM  Result Value Ref Range Status   SARS Coronavirus 2 by RT PCR POSITIVE (A) NEGATIVE Final    Comment: RESULT CALLED TO, READ BACK BY AND VERIFIED WITH:  POWELL,VR RN @1958  04/08/21 EDENSCA (NOTE) SARS-CoV-2 target nucleic acids are DETECTED.  The SARS-CoV-2 RNA is generally detectable in upper respiratory specimens during the acute phase of infection. Positive results are indicative of the presence of the identified virus, but do not rule out bacterial infection or co-infection with other pathogens not detected by the test. Clinical correlation with patient history and other diagnostic information is necessary to determine patient infection status. The expected result is Negative.  Fact Sheet for Patients: 06/08/21  Fact Sheet for Healthcare Providers: BloggerCourse.com  This test is not yet approved or cleared by the [place] FDA and  has been authorized for detection and/or diagnosis of SARS-CoV-2 by FDA under an Emergency Use Authorization (EUA).  This EUA will remain in effect (meaning this test can   be used) for the duration of  the COVID-19 declaration under Section 564(b)(1) of the Act, 21 U.S.C. section 360bbb-3(b)(1), unless the authorization is terminated or revoked sooner.     Influenza A by PCR  NEGATIVE NEGATIVE Final   Influenza B by PCR NEGATIVE NEGATIVE Final    Comment: (NOTE) The Xpert Xpress SARS-CoV-2/FLU/RSV plus assay is intended as an aid in the diagnosis of influenza from Nasopharyngeal swab specimens and should not be used as a sole basis for treatment. Nasal washings and aspirates are unacceptable for Xpert Xpress SARS-CoV-2/FLU/RSV testing.  Fact Sheet for Patients: BloggerCourse.com  Fact Sheet for Healthcare Providers: SeriousBroker.it  This test is not yet approved or cleared by the [place] FDA and has been authorized for detection and/or diagnosis of SARS-CoV-2 by FDA under an Emergency Use Authorization (EUA). This EUA will remain in effect (meaning this test can be used) for the duration of the COVID-19 declaration under Section 564(b)(1) of the Act, 21 U.S.C. section 360bbb-3(b)(1), unless the authorization is terminated or revoked.  Performed at [facility], [address] [place], [place] [address]           Radiology Studies: No results found.      Scheduled Meds:  aspirin  81 mg Oral Daily   atorvastatin  40 mg Oral QHS   clopidogrel  75 mg Oral Daily   enoxaparin (LOVENOX) injection  40 mg Subcutaneous Q24H   famotidine  20 mg Oral BH-q7a   hydrALAZINE  25 mg Oral TID   insulin aspart  0-5 Units Subcutaneous QHS   insulin aspart  0-9 Units Subcutaneous TID WC   metoprolol tartrate  50 mg Oral BID   Continuous Infusions:  cefTRIAXone (ROCEPHIN)  IV 200 mL/hr at 04/12/21 0106     LOS: 3 days    Time spent: 35 minutes    Ramiro Harvest, MD [facility]   To contact the attending provider between 7A-7P or the covering provider during  after hours 7P-7A, please log into the web site [email or web address] and access using universal [facility] password for that web site. If you do not have the password, please call the hospital operator.  04/12/2021, 10:47 AM

## 2021-04-13 DIAGNOSIS — G9340 Encephalopathy, unspecified: Secondary | ICD-10-CM | POA: Diagnosis not present

## 2021-04-13 DIAGNOSIS — E119 Type 2 diabetes mellitus without complications: Secondary | ICD-10-CM | POA: Diagnosis not present

## 2021-04-13 DIAGNOSIS — F0390 Unspecified dementia without behavioral disturbance: Secondary | ICD-10-CM | POA: Diagnosis not present

## 2021-04-13 DIAGNOSIS — N3000 Acute cystitis without hematuria: Secondary | ICD-10-CM | POA: Diagnosis not present

## 2021-04-13 LAB — BASIC METABOLIC PANEL
Anion gap: 5 (ref 5–15)
BUN: 7 mg/dL — ABNORMAL LOW (ref 8–23)
CO2: 25 mmol/L (ref 22–32)
Calcium: 8.5 mg/dL — ABNORMAL LOW (ref 8.9–10.3)
Chloride: 108 mmol/L (ref 98–111)
Creatinine, Ser: 0.53 mg/dL (ref 0.44–1.00)
GFR, Estimated: >60 mL/min (ref >60)
Glucose, Bld: 106 mg/dL — ABNORMAL HIGH (ref 70–99)
Potassium: 3.5 mmol/L (ref 3.5–5.1)
Sodium: 138 mmol/L (ref 135–145)

## 2021-04-13 LAB — CULTURE, BLOOD (ROUTINE X 2)
Culture: NO GROWTH
Culture: NO GROWTH
Special Requests: ADEQUATE
Special Requests: ADEQUATE

## 2021-04-13 LAB — GLUCOSE, CAPILLARY
Glucose-Capillary: 96 mg/dL (ref 70–99)
Glucose-Capillary: 99 mg/dL (ref 70–99)
Glucose-Capillary: 102 mg/dL — ABNORMAL HIGH (ref 70–99)
Glucose-Capillary: 128 mg/dL — ABNORMAL HIGH (ref 70–99)

## 2021-04-13 LAB — CBC
HCT: 34.9 % — ABNORMAL LOW (ref 36.0–46.0)
Hemoglobin: 11.2 g/dL — ABNORMAL LOW (ref 12.0–15.0)
MCH: 27.7 pg (ref 26.0–34.0)
MCHC: 32.1 g/dL (ref 30.0–36.0)
MCV: 86.2 fL (ref 80.0–100.0)
Platelets: 210 10*3/uL (ref 150–400)
RBC: 4.05 MIL/uL (ref 3.87–5.11)
RDW: 17.1 % — ABNORMAL HIGH (ref 11.5–15.5)
WBC: 4.4 10*3/uL (ref 4.0–10.5)
nRBC: 0 % (ref 0.0–0.2)

## 2021-04-13 MED ORDER — POTASSIUM CHLORIDE CRYS ER 10 MEQ PO TBCR
20.0000 meq | EXTENDED_RELEASE_TABLET | Freq: Once | ORAL | Status: AC
  Administered 2021-04-13: 20 meq via ORAL
  Filled 2021-04-13: qty 2

## 2021-04-13 NOTE — TOC Progression Note (Signed)
Transition of Care [facility]) - Progression Note    Patient Details  Name: Leslie Sherman MRN: [id number] Date of Birth: 22-Jan-1925  Transition of Care [facility]) CM/SW Contact  Amada Jupiter, LCSW Phone Number: 04/13/2021, 4:22 PM  Clinical Narrative:    Pt and family have accepted SNF bed offer from [facility] in [place] who can admit pt tomorrow.  No COVID test needed due to recent + result.  Will alert TOC weekend coverage who will arrange for PTAR transport when summary complete.  RN will need to call report TOMORROW to [phone].     Expected Discharge Plan: Skilled Nursing Facility Barriers to Discharge: Continued Medical Work up, SNF Pending bed offer  Expected Discharge Plan and Services Expected Discharge Plan: Skilled Nursing Facility In-house Referral: Clinical Social Work     Living arrangements for the past 2 months: Assisted Living Facility [facility])                 DME Arranged: N/A DME Agency: NA                   Social Determinants of Health (SDOH) Interventions    Readmission Risk Interventions No flowsheet data found.

## 2021-04-13 NOTE — Progress Notes (Signed)
PROGRESS NOTE    Leslie Sherman  OVF:[id number] DOB: 01/15/1925 DOA: 04/08/2021 PCP: Leola Brazil, DO    Chief Complaint  Patient presents with   Altered Mental Status    Brief Narrative:  [name] is a very pleasant 87 y.o. female with medical history significant for dementia, hypertension, type 2 diabetes mellitus, and history of CVA, now presenting to the emergency department from her ALF for evaluation of increased confusion, lethargy, anorexia, dysuria, and urinary frequency.  Patient admitted for acute metabolic encephalopathy in the setting of likely UTI, she does not meet sepsis criteria at intake.  Of note patient has received 4 immunizations for COVID-19, tested positive on March 05, 2021 and has completed her isolation.     Assessment & Plan:   Principal Problem:   Acute encephalopathy Active Problems:   UTI (urinary tract infection)   History of ischemic stroke   Dementia (HCC)   Hypertension   Diabetes mellitus type II, non insulin dependent (HCC)   Lab test positive for detection of COVID-19 virus   1 acute metabolic encephalopathy, POA -Likely secondary to E. coli UTI. -Patient had presented with worsening confusion, lethargy, anorexia. -Urinalysis done concerning for UTI patient on IV Rocephin. -Improving clinically with improvement with confusion however still with significant weakness. -Status post 5 days IV antibiotics.  -PT/OT following and recommending SNF placement. -TOC consulted and following.  2.  E. coli UTI, sepsis ruled out, POA -Patient presented with worsening confusion lethargic, dysuria, urinary frequency urinalysis concerning for UTI. -Urine cultures positive for E. coli. -Status post 5 days IV Rocephin.   -No further antibiotics needed.   3.  Hypertension -Lopressor, hydralazine.  4.  Diabetes mellitus type 2, non-insulin-dependent, well controlled -Hemoglobin A1c noted to be 6.2 on May 2022. -CBG 96 this morning. -Oral  hypoglycemic agents on hold.   -SSI.   5.  History of CVA -Continue Plavix, aspirin, statin. -PT/OT.  6.  Positive COVID test -Patient noted to have previously tested positive on 03/05/2021 completed 3-week quarantine currently asymptomatic. -No indication to test patient given high likelihood of a false positive, unfortunately patient swabbed on admission and found to be incidentally positive. -Concerned that likely viral shedding at the time of inactive virus status post postacute illness and recovery. -Supportive care. -Off airborne and contact precautions. -No further testing recommended per  7.  Dementia -Stable. -Seroquel nightly as needed agitation.  8.  Hypokalemia -Potassium 3.5.   -We will give 20 mEq of K. Dur x1.       DVT prophylaxis: Lovenox Code Status: DNR Family Communication: Updated patient, daughter at bedside Disposition:   Status is: Inpatient  Remains inpatient appropriate because:Inpatient level of care appropriate due to severity of illness  Dispo: The patient is from: ALF              Anticipated d/c is to: SNF hopefully in the next 24 hours.              Patient currently is medically stable for discharge.   Difficult to place patient No       Consultants:  None  Procedures:  CT head without contrast 04/08/2021 Chest x-ray 04/08/2021   Antimicrobials:  IV Rocephin 04/08/2021>>>> 04/13/2021   Subjective: Sleeping but arousable.  No chest pain.  No shortness of breath.  No abdominal pain.  Overall feeling better.    Objective: Vitals:   04/12/21 1006 04/12/21 1316 04/12/21 2119 04/13/21 0530  BP: (!) 160/66 (!) 129/57 140/70 Marland Kitchen)  157/77  Pulse: 60 (!) 58 72 65  Resp: 18 18 18 16   Temp: 98.5 F (36.9 C) 99.2 F (37.3 C) 98.2 F (36.8 C)   TempSrc: Oral Oral Oral   SpO2: 100% 97% 96% 97%  Weight:      Height:        Intake/Output Summary (Last 24 hours) at 04/13/2021 1132 Last data filed at 04/13/2021 1000 Gross per 24  hour  Intake 420 ml  Output 950 ml  Net -530 ml    Filed Weights   04/08/21 1704  Weight: 83.5 kg    Examination:  General exam: : NAD Respiratory system: CTA B.  No wheezes, no rhonchi.  Speaking in full sentences.  Normal respiratory effort. Cardiovascular system: Regular rate and rhythm no murmurs rubs or gallops.  No JVD.  No lower extremity edema.  Gastrointestinal system: Abdomen soft, nontender, nondistended, positive bowel sounds.  No rebound.  No guarding. Central nervous system: Alert and oriented. No focal neurological deficits. Extremities: Symmetric 5 x 5 power. Skin: No rashes, lesions or ulcers Psychiatry: Judgement and insight appear normal. Mood & affect appropriate.  Data Reviewed: I have personally reviewed following labs and imaging studies  CBC: Recent Labs  Lab 04/08/21 1732 04/09/21 1335 04/10/21 0824 04/12/21 0811 04/13/21 0742  WBC 5.7 5.1 4.3 4.7 4.4  NEUTROABS 3.8 3.3  --   --   --   HGB 12.8 11.6* 10.7* 11.2* 11.2*  HCT 39.9 38.2 33.1* 35.9* 34.9*  MCV 85.8 89.3 86.2 87.6 86.2  PLT 294 224 221 198 210     Basic Metabolic Panel: Recent Labs  Lab 04/08/21 1732 04/09/21 1335 04/10/21 0824 04/12/21 0811 04/13/21 0742  NA 141 142 142 146* 138  K 3.6 3.4* 3.4* 3.5 3.5  CL 106 112* 112* 115* 108  CO2 26 23 25 23 25   GLUCOSE 134* 140* 95 103* 106*  BUN 11 8 8 8  7*  CREATININE 0.83 0.67 0.61 0.55 0.53  CALCIUM 9.4 8.7* 8.5* 8.9 8.5*  MG  --  1.8  --  2.0  --   PHOS  --  3.0  --   --   --      GFR: Estimated Creatinine Clearance: 44.9 mL/min (by C-G formula based on SCr of 0.53 mg/dL).  Liver Function Tests: Recent Labs  Lab 04/08/21 1732 04/09/21 1335  AST 45* 50*  ALT 25 25  ALKPHOS 135* 122  BILITOT 0.7 1.0  PROT 8.2* 7.3  ALBUMIN 4.0 3.6     CBG: Recent Labs  Lab 04/12/21 1121 04/12/21 1628 04/12/21 2116 04/13/21 0717 04/13/21 1115  GLUCAP 114* 90 137* 96 99      Recent Results (from the past 240  hour(s))  Blood culture (routine x 2)     Status: None   Collection Time: 04/08/21  5:32 PM   Specimen: BLOOD  Result Value Ref Range Status   Specimen Description   Final    BLOOD RIGHT ANTECUBITAL Performed at [facility], [address] [place], [facility] [address]    Special Requests   Final    BOTTLES DRAWN AEROBIC AND ANAEROBIC Blood Culture adequate volume Performed at [facility], [address] [place], [facility] [address]    Culture   Final    NO GROWTH 5 DAYS Performed at [facility], [address], [place], [facility] [address]    Report Status 04/13/2021 FINAL  Final  Blood culture (routine  x 2)     Status: None   Collection Time: 04/08/21  5:37 PM   Specimen: BLOOD  Result Value Ref Range Status   Specimen Description   Final    BLOOD LEFT ANTECUBITAL Performed at [facility], [address] [place], [place] [address]    Special Requests   Final    BOTTLES DRAWN AEROBIC AND ANAEROBIC Blood Culture adequate volume Performed at [facility], [address] [place], [place] [address]    Culture   Final    NO GROWTH 5 DAYS Performed at [facility], [address] [place], [place] [address]    Report Status 04/13/2021 FINAL  Final  Urine Culture     Status: Abnormal   Collection Time: 04/08/21  5:45 PM   Specimen: Urine, Random  Result Value Ref Range Status   Specimen Description   Final    URINE, RANDOM Performed at [facility], [address] [place], [place] [address]    Special Requests   Final    NONE Performed at [facility], [address] [place], [place] [address]    Culture (A)  Final    >=100,000 COLONIES/mL ESCHERICHIA COLI >=100,000 COLONIES/mL AEROCOCCUS URINAE Standardized susceptibility testing for this organism is not available. Performed at [facility], [address], [place], [place] [address]    Report Status 04/12/2021 FINAL  Final    Organism ID, Bacteria ESCHERICHIA COLI (A)  Final      Susceptibility   Escherichia coli - MIC*    AMPICILLIN <=2 SENSITIVE Sensitive     CEFAZOLIN <=4 SENSITIVE Sensitive     CEFEPIME <=0.12 SENSITIVE Sensitive     CEFTRIAXONE <=0.25 SENSITIVE Sensitive     CIPROFLOXACIN <=0.25 SENSITIVE Sensitive     GENTAMICIN <=1 SENSITIVE Sensitive     IMIPENEM <=0.25 SENSITIVE Sensitive     NITROFURANTOIN <=16 SENSITIVE Sensitive     TRIMETH/SULFA <=20 SENSITIVE Sensitive     AMPICILLIN/SULBACTAM <=2 SENSITIVE Sensitive     PIP/TAZO <=4 SENSITIVE Sensitive     * >=100,000 COLONIES/mL ESCHERICHIA COLI  Resp Panel by RT-PCR (Flu A&B, Covid)     Status: Abnormal   Collection Time: 04/08/21  7:07 PM  Result Value Ref Range Status   SARS Coronavirus 2 by RT PCR POSITIVE (A) NEGATIVE Final    Comment: RESULT CALLED TO, READ BACK BY AND VERIFIED WITH:  POWELL,VR RN @1958  04/08/21 EDENSCA (NOTE) SARS-CoV-2 target nucleic acids are DETECTED.  The SARS-CoV-2 RNA is generally detectable in upper respiratory specimens during the acute phase of infection. Positive results are indicative of the presence of the identified virus, but do not rule out bacterial infection or co-infection with other pathogens not detected by the test. Clinical correlation with patient history and other diagnostic information is necessary to determine patient infection status. The expected result is Negative.  Fact Sheet for Patients: 06/08/21  Fact Sheet for Healthcare Providers: BloggerCourse.com  This test is not yet approved or cleared by the [place] FDA and  has been authorized for detection and/or diagnosis of SARS-CoV-2 by FDA under an Emergency Use Authorization (EUA).  This EUA will remain in effect (meaning this test can  be used) for the duration of  the COVID-19 declaration under Section 564(b)(1) of the Act, 21 U.S.C. section 360bbb-3(b)(1), unless  the authorization is terminated or revoked sooner.     Influenza A by PCR NEGATIVE NEGATIVE  Final   Influenza B by PCR NEGATIVE NEGATIVE Final    Comment: (NOTE) The Xpert Xpress SARS-CoV-2/FLU/RSV plus assay is intended as an aid in the diagnosis of influenza from Nasopharyngeal swab specimens and should not be used as a sole basis for treatment. Nasal washings and aspirates are unacceptable for Xpert Xpress SARS-CoV-2/FLU/RSV testing.  Fact Sheet for Patients: BloggerCourse.com  Fact Sheet for Healthcare Providers: SeriousBroker.it  This test is not yet approved or cleared by the [place] FDA and has been authorized for detection and/or diagnosis of SARS-CoV-2 by FDA under an Emergency Use Authorization (EUA). This EUA will remain in effect (meaning this test can be used) for the duration of the COVID-19 declaration under Section 564(b)(1) of the Act, 21 U.S.C. section 360bbb-3(b)(1), unless the authorization is terminated or revoked.  Performed at [facility], [address], [place], [place] [address]           Radiology Studies: No results found.      Scheduled Meds:  aspirin  81 mg Oral Daily   atorvastatin  40 mg Oral QHS   clopidogrel  75 mg Oral Daily   enoxaparin (LOVENOX) injection  40 mg Subcutaneous Q24H   famotidine  20 mg Oral BH-[id number]   hydrALAZINE  25 mg Oral TID   insulin aspart  0-5 Units Subcutaneous QHS   insulin aspart  0-9 Units Subcutaneous TID WC   metoprolol tartrate  50 mg Oral BID   Continuous Infusions:     LOS: 4 days    Time spent: 35 minutes    Ramiro Harvest, MD [facility]   To contact the attending provider between 7A-7P or the covering provider during after hours 7P-7A, please log into the web site [email or web address] and access using universal [place] password for that web site. If you do not have the password, please call the hospital  operator.  04/13/2021, 11:32 AM

## 2021-04-14 DIAGNOSIS — E119 Type 2 diabetes mellitus without complications: Secondary | ICD-10-CM | POA: Diagnosis not present

## 2021-04-14 DIAGNOSIS — B9620 Unspecified Escherichia coli [E. coli] as the cause of diseases classified elsewhere: Secondary | ICD-10-CM

## 2021-04-14 DIAGNOSIS — N3000 Acute cystitis without hematuria: Secondary | ICD-10-CM | POA: Diagnosis not present

## 2021-04-14 DIAGNOSIS — F0390 Unspecified dementia without behavioral disturbance: Secondary | ICD-10-CM | POA: Diagnosis not present

## 2021-04-14 DIAGNOSIS — N390 Urinary tract infection, site not specified: Principal | ICD-10-CM

## 2021-04-14 DIAGNOSIS — G9340 Encephalopathy, unspecified: Secondary | ICD-10-CM | POA: Diagnosis not present

## 2021-04-14 LAB — GLUCOSE, CAPILLARY
Glucose-Capillary: 95 mg/dL (ref 70–99)
Glucose-Capillary: 122 mg/dL — ABNORMAL HIGH (ref 70–99)
Glucose-Capillary: 90 mg/dL (ref 70–99)

## 2021-04-14 MED ORDER — SENNOSIDES-DOCUSATE SODIUM 8.6-50 MG PO TABS: 1.0000 | ORAL_TABLET | Freq: Every evening | ORAL | Status: AC | PRN

## 2021-04-14 MED ORDER — QUETIAPINE FUMARATE 25 MG PO TABS: 12.5000 mg | ORAL_TABLET | Freq: Every evening | ORAL | 0 refills | Status: AC | PRN

## 2021-04-14 MED ORDER — GUAIFENESIN-DM 100-10 MG/5ML PO SYRP: 10.0000 mL | ORAL_SOLUTION | ORAL | 0 refills | Status: AC | PRN

## 2021-04-14 NOTE — TOC Transition Note (Addendum)
Transition of Care [facility]) - CM/SW Discharge Note   Patient Details  Name: Leslie Sherman MRN: [id number] Date of Birth: [birth year]  Transition of [facility] CM/SW Contact:  Maryjean Ka, LCSW Phone Number: 04/14/2021, 11:24 AM   Clinical Narrative:    Patient will be dc today to [facility]. Pt and family agreeable with plan. With transport via EMS. RN to call report [phone].   CSW contacted Justin Mend [phone]) with [facility] (SNF) to inform that patient will be transfer today via voicemail and that discharge summary has been faxed to [phone].  CSW contacted patient's daughter Brenetta [phone] and informed that pt will be discharge today to Skilled Nursing (SNF)Facility via EMS.   CSW notified bedside RN.   @11 :50am CSW received a phone call from [facility] with [facility] to confirm that DC summary has been received.   Final next level of care: Skilled Nursing Facility Barriers to Discharge: Barriers Resolved   Patient Goals and CMS Choice Patient states their goals for this hospitalization and ongoing recovery are:: Go to SNF for short term rehab and then to return home. CMS Medicare.gov Compare Post Acute Care list provided to:: Patient Represenative (must comment) Choice offered to / list presented to : Adult Children  Discharge Placement PASRR number recieved: 04/12/21            Patient chooses bed at: [facility] Patient to be transferred to facility by: [facility] Name of family member notified: Daughter-Mattia; [phone] Patient and family notified of of transfer: 04/14/21  Discharge Plan and Services In-house Referral: Clinical Social Work              DME Arranged: N/A DME Agency: NA                  Social Determinants of Health (SDOH) Interventions     Readmission Risk Interventions No flowsheet data found.

## 2021-04-14 NOTE — Progress Notes (Signed)
Attempted to call report to SNF

## 2021-04-14 NOTE — Discharge Summary (Signed)
Physician Discharge Summary  Leslie Sherman:[id number] DOB: August 16, 1924 DOA: 04/08/2021  PCP: Leola Brazil, DO  Admit date: 04/08/2021 Discharge date: 04/14/2021  Time spent: 55 minutes  Recommendations for Outpatient Follow-up:  Patient was discharged to skilled nursing facility. Follow-up with MD at skilled nursing facility.  Patient will need a basic metabolic profile done in 1 week to follow-up on electrolytes and renal function.   Discharge Diagnoses:  Principal Problem:   Acute encephalopathy Active Problems:   UTI (urinary tract infection)   History of ischemic stroke   Dementia (HCC)   Hypertension   Diabetes mellitus type II, non insulin dependent (HCC)   Lab test positive for detection of COVID-19 virus   E. coli UTI   Discharge Condition: Stable and improved  Diet recommendation: Regular  Filed Weights   04/08/21 1704  Weight: 83.5 kg    History of present illness:  HPI per Dr. Veronia Beets is a very pleasant 87 y.o. female with medical history significant for dementia, hypertension, type 2 diabetes mellitus, and history of CVA, now presenting to the emergency department from her ALF for evaluation of increased confusion, lethargy, anorexia, dysuria, and urinary frequency.  Patient appeared to be in her usual state on 04/04/2021 when her daughter was visiting her but was noted to have urinary frequency and complaint of dysuria shortly after this.  Urinary issues continued and then on day of admission, she was lethargic, not engaging in her usual activities, and not eating or drinking.  Patient provided some limited history, acknowledges recent dysuria, denies abdominal or flank pain, and denies nausea or vomiting.   She has received 4 immunizations for COVID-19, tested positive on March 05, 2021, had severe malaise and fatigue per report of family, but fully recovered from that and was taken off of isolation approximately 3 weeks ago.   [facility] ED Course:  Upon arrival to the ED, patient is found to be afebrile, saturating well on room air, and with stable blood pressure.  EKG features sinus rhythm and chest x-ray negative for acute cardiopulmonary disease.  Head CT negative for acute findings but notable for an old left basal ganglia lacunar infarction.  Chemistry panel features mild elevation in alkaline phosphatase, AST, and total protein.  CBC is unremarkable.  Lactic acid elevated to 2.8.  COVID-19 PCR was positive with cycle threshold 42.1.  Blood and urine cultures were collected in the emergency department and the patient was treated with Rocephin and a liter of saline prior to being transferred to [facility] for ongoing management.  Hospital Course:  1 acute metabolic encephalopathy, POA -Likely secondary to E. coli UTI. -Patient had presented with worsening confusion, lethargy, anorexia. -Urinalysis done concerning for UTI, placed on IV Rocephin.   -Improved clinically, however still with significant weakness. -Status post 5 days IV antibiotics.  -PT/OT following and recommended SNF placement. -TOC consulted and patient was discharged to skilled nursing facility.  2.  E. coli UTI, sepsis ruled out, POA -Patient presented with worsening confusion lethargic, dysuria, urinary frequency urinalysis concerning for UTI. -Urine cultures positive for E. coli. -Status post 5 days IV Rocephin.   -No further antibiotics needed.   3.  Hypertension -Patient maintained on Lopressor, hydralazine.  4.  Diabetes mellitus type 2, non-insulin-dependent, well controlled -Hemoglobin A1c noted to be 6.2 on May 2022. -Patient's oral hypoglycemic agents were held during the hospitalization and patient maintained on sliding scale insulin.   -Oral hypoglycemic agents will be resumed on discharge.  5.  History of CVA -Patient maintained on aspirin, Plavix, statin.   -Patient seen by PT/OT who recommended SNF placement.    6.  Positive COVID test -Patient noted  to have previously tested positive on 03/05/2021 completed 3-week quarantine currently asymptomatic. -No indication to test patient given high likelihood of a false positive, unfortunately patient swabbed on admission and found to be incidentally positive. -Concerned that likely viral shedding at the time of inactive virus status post postacute illness and recovery. -Supportive care. -Off airborne and contact precautions. -No further testing recommended.  7.  Dementia -Stable. -Seroquel nightly as needed for agitation.  8.  Hypokalemia -Repleted.     Procedures: CT head without contrast 04/08/2021 Chest x-ray 04/08/2021  Consultations: None  Discharge Exam: Vitals:   04/13/21 2203 04/14/21 0524  BP: (!) 126/43 (!) 154/56  Pulse: 83 65  Resp:  16  Temp:    SpO2: 100% 97%    General: NAD Cardiovascular: RRR no murmurs rubs or gallops.  No JVD.  No lower extremity edema Respiratory: Clear to auscultation bilaterally anterior lung fields.  No wheezes, no crackles, no rhonchi  Discharge Instructions   Discharge Instructions     Diet general   Complete by: As directed    Increase activity slowly   Complete by: As directed       Allergies as of 04/14/2021   No Known Allergies      Medication List     STOP taking these medications    docusate sodium 100 MG capsule Commonly known as: COLACE   oxyCODONE-acetaminophen 5-325 MG tablet Commonly known as: PERCOCET/ROXICET       TAKE these medications    acetaminophen 500 MG tablet Commonly known as: TYLENOL Take 1,000 mg by mouth every 8 (eight) hours as needed for mild pain. What changed: Another medication with the same name was removed. Continue taking this medication, and follow the directions you see here.   aspirin 81 MG chewable tablet Chew 81 mg by mouth daily.   atorvastatin 40 MG tablet Commonly known as: LIPITOR Take 40 mg by mouth at bedtime.   clopidogrel 75 MG tablet Commonly known as:  PLAVIX Take 75 mg by mouth daily.   famotidine 20 MG tablet Commonly known as: PEPCID Take 20 mg by mouth every morning.   guaiFENesin-dextromethorphan 100-10 MG/5ML syrup Commonly known as: ROBITUSSIN DM Take 10 mLs by mouth every 4 (four) hours as needed for cough.   hydrALAZINE 25 MG tablet Commonly known as: APRESOLINE Take 25 mg by mouth 3 (three) times daily.   metFORMIN 500 MG 24 hr tablet Commonly known as: GLUCOPHAGE-XR Take 500 mg by mouth daily. What changed: Another medication with the same name was removed. Continue taking this medication, and follow the directions you see here.   metoprolol tartrate 50 MG tablet Commonly known as: LOPRESSOR Take 50 mg by mouth 2 (two) times daily.   MULTIVITAMINS PO Take 1 tablet by mouth daily.   QUEtiapine 25 MG tablet Commonly known as: SEROQUEL Take 0.5 tablets (12.5 mg total) by mouth at bedtime as needed (AGITATION).   senna-docusate 8.6-50 MG tablet Commonly known as: Senokot-S Take 1 tablet by mouth at bedtime as needed for mild constipation.       No Known Allergies  Contact information for follow-up providers     MD AT SNF Follow up.               Contact information for after-discharge care  Destination     [facility] .   Service: Skilled Nursing Contact information: [address] [place] [address] [phone]                      The results of significant diagnostics from this hospitalization (including imaging, microbiology, ancillary and laboratory) are listed below for reference.    Significant Diagnostic Studies: CT Head Wo Contrast  Result Date: 04/08/2021 CLINICAL DATA:  Altered mental status EXAM: CT HEAD WITHOUT CONTRAST TECHNIQUE: Contiguous axial images were obtained from the base of the skull through the vertex without intravenous contrast. COMPARISON:  01/05/2021 FINDINGS: Brain: Old left basal ganglia lacunar infarct. There  is atrophy and chronic small vessel disease changes. No acute intracranial abnormality. Specifically, no hemorrhage, hydrocephalus, mass lesion, acute infarction, or significant intracranial injury. Vascular: No hyperdense vessel or unexpected calcification. Skull: No acute calvarial abnormality. Sinuses/Orbits: Areas of mucosal thickening.  No air-fluid levels. Other: None IMPRESSION: Old left basal ganglia lacunar infarct. Atrophy, chronic microvascular disease. No acute intracranial abnormality. Electronically Signed   By: Charlett Nose M.D.   On: 04/08/2021 18:27   DG Chest Portable 1 View  Result Date: 04/08/2021 CLINICAL DATA:  Altered mental status EXAM: PORTABLE CHEST 1 VIEW COMPARISON:  02/01/2016 FINDINGS: Heart is normal size. Aortic atherosclerosis. No confluent opacities or effusions. No acute bony abnormality. IMPRESSION: No active disease. Electronically Signed   By: Charlett Nose M.D.   On: 04/08/2021 18:28    Microbiology: Recent Results (from the past 240 hour(s))  Blood culture (routine x 2)     Status: None   Collection Time: 04/08/21  5:32 PM   Specimen: BLOOD  Result Value Ref Range Status   Specimen Description   Final    BLOOD RIGHT ANTECUBITAL Performed at [facility], [address] [place], [place] [address]    Special Requests   Final    BOTTLES DRAWN AEROBIC AND ANAEROBIC Blood Culture adequate volume Performed at [facility], [address] [place], [place] [address]    Culture   Final    NO GROWTH 5 DAYS Performed at [facility], [address], [place], [place] [address]    Report Status 04/13/2021 FINAL  Final  Blood culture (routine x 2)     Status: None   Collection Time: 04/08/21  5:37 PM   Specimen: BLOOD  Result Value Ref Range Status   Specimen Description   Final    BLOOD LEFT ANTECUBITAL Performed at [facility], [address] [place], [place] [address]    Special Requests   Final    BOTTLES DRAWN  AEROBIC AND ANAEROBIC Blood Culture adequate volume Performed at [facility], [address] [place], [place] [address]    Culture   Final    NO GROWTH 5 DAYS Performed at [facility], [address] [place], [place] [address]    Report Status 04/13/2021 FINAL  Final  Urine Culture     Status: Abnormal   Collection Time: 04/08/21  5:45 PM   Specimen: Urine, Random  Result Value Ref Range Status   Specimen Description   Final    URINE, RANDOM Performed at [facility], [address] [place], [place] [address]    Special Requests   Final    NONE Performed at [facility], [address] [place],  [place] [address]    Culture (A)  Final    >=100,000 COLONIES/mL ESCHERICHIA COLI >=100,000 COLONIES/mL AEROCOCCUS URINAE Standardized susceptibility testing for this organism is not available. Performed at [facility], [address] [place], [place] [address]    Report Status 04/12/2021 FINAL  Final   Organism ID, Bacteria ESCHERICHIA COLI (A)  Final      Susceptibility   Escherichia coli - MIC*    AMPICILLIN <=2 SENSITIVE Sensitive     CEFAZOLIN <=4 SENSITIVE Sensitive     CEFEPIME <=0.12 SENSITIVE Sensitive     CEFTRIAXONE <=0.25 SENSITIVE Sensitive     CIPROFLOXACIN <=0.25 SENSITIVE Sensitive     GENTAMICIN <=1 SENSITIVE Sensitive     IMIPENEM <=0.25 SENSITIVE Sensitive     NITROFURANTOIN <=16 SENSITIVE Sensitive     TRIMETH/SULFA <=20 SENSITIVE Sensitive     AMPICILLIN/SULBACTAM <=2 SENSITIVE Sensitive     PIP/TAZO <=4 SENSITIVE Sensitive     * >=100,000 COLONIES/mL ESCHERICHIA COLI  Resp Panel by RT-PCR (Flu A&B, Covid)     Status: Abnormal   Collection Time: 04/08/21  7:07 PM  Result Value Ref Range Status   SARS Coronavirus 2 by RT PCR POSITIVE (A) NEGATIVE Final    Comment: RESULT CALLED TO, READ BACK BY AND VERIFIED WITH:  POWELL,VR RN @1958  04/08/21 EDENSCA (NOTE) SARS-CoV-2 target nucleic acids are  DETECTED.  The SARS-CoV-2 RNA is generally detectable in upper respiratory specimens during the acute phase of infection. Positive results are indicative of the presence of the identified virus, but do not rule out bacterial infection or co-infection with other pathogens not detected by the test. Clinical correlation with patient history and other diagnostic information is necessary to determine patient infection status. The expected result is Negative.  Fact Sheet for Patients: 06/08/21  Fact Sheet for Healthcare Providers: BloggerCourse.com  This test is not yet approved or cleared by the [employer] FDA and  has been authorized for detection and/or diagnosis of SARS-CoV-2 by FDA under an Emergency Use Authorization (EUA).  This EUA will remain in effect (meaning this test can  be used) for the duration of  the COVID-19 declaration under Section 564(b)(1) of the Act, 21 U.S.C. section 360bbb-3(b)(1), unless the authorization is terminated or revoked sooner.     Influenza A by PCR NEGATIVE NEGATIVE Final   Influenza B by PCR NEGATIVE NEGATIVE Final    Comment: (NOTE) The Xpert Xpress SARS-CoV-2/FLU/RSV plus assay is intended as an aid in the diagnosis of influenza from Nasopharyngeal swab specimens and should not be used as a sole basis for treatment. Nasal washings and aspirates are unacceptable for Xpert Xpress SARS-CoV-2/FLU/RSV testing.  Fact Sheet for Patients: [place]  Fact Sheet for Healthcare Providers: BloggerCourse.com  This test is not yet approved or cleared by the [employer] FDA and has been authorized for detection and/or diagnosis of SARS-CoV-2 by FDA under an Emergency Use Authorization (EUA). This EUA will remain in effect (meaning this test can be used) for the duration of the COVID-19 declaration under Section 564(b)(1) of the Act, 21  U.S.C. section 360bbb-3(b)(1), unless the authorization is terminated or revoked.  Performed at [facility], [address] [place], [place]      Labs: Basic Metabolic Panel: Recent Labs  Lab 04/08/21 1732 04/09/21 1335 04/10/21 0824 04/12/21 0811 04/13/21 0742  NA 141 142 142 146* 138  K 3.6 3.4* 3.4* 3.5 3.5  CL 106 112* 112* 115* 108  CO2 26 23  25 23 25   GLUCOSE 134* 140* 95 103* 106*  BUN 11 8 8 8  7*  CREATININE 0.83 0.67 0.61 0.55 0.53  CALCIUM 9.4 8.7* 8.5* 8.9 8.5*  MG  --  1.8  --  2.0  --   PHOS  --  3.0  --   --   --    Liver Function Tests: Recent Labs  Lab 04/08/21 1732 04/09/21 1335  AST 45* 50*  ALT 25 25  ALKPHOS 135* 122  BILITOT 0.7 1.0  PROT 8.2* 7.3  ALBUMIN 4.0 3.6   Recent Labs  Lab 04/08/21 1732  LIPASE 31   No results for input(s): AMMONIA in the last 168 hours. CBC: Recent Labs  Lab 04/08/21 1732 04/09/21 1335 04/10/21 0824 04/12/21 0811 04/13/21 0742  WBC 5.7 5.1 4.3 4.7 4.4  NEUTROABS 3.8 3.3  --   --   --   HGB 12.8 11.6* 10.7* 11.2* 11.2*  HCT 39.9 38.2 33.1* 35.9* 34.9*  MCV 85.8 89.3 86.2 87.6 86.2  PLT 294 224 221 198 210   Cardiac Enzymes: No results for input(s): CKTOTAL, CKMB, CKMBINDEX, TROPONINI in the last 168 hours. BNP: BNP (last 3 results) No results for input(s): BNP in the last 8760 hours.  ProBNP (last 3 results) No results for input(s): PROBNP in the last 8760 hours.  CBG: Recent Labs  Lab 04/13/21 0717 04/13/21 1115 04/13/21 1607 04/13/21 2136 04/14/21 0736  GLUCAP 96 99 102* 128* 95       Signed:  2137 MD.  [facility] 04/14/2021, 10:30 AM

## 2021-04-14 NOTE — Plan of Care (Signed)
Discharge instructions were given to facility. Patient was transported to facility by [facility]

## 2021-05-01 DEATH — deceased

## 2022-08-11 IMAGING — CT CT HEAD W/O CM
3 series · 15 of 47 positions shown, 18 images · non-contrast
Comparison: 01/05/2021

CLINICAL DATA: Altered mental status

EXAM:
CT HEAD WITHOUT CONTRAST
TECHNIQUE: Contiguous axial images were obtained from the base of the skull
through the vertex without intravenous contrast.

[Series 2: head wo · axial · 0.43mm/px · z∈[-720,-585]mm · 9 of 33 slices shown, 12 images]
[im 3/33  brain]
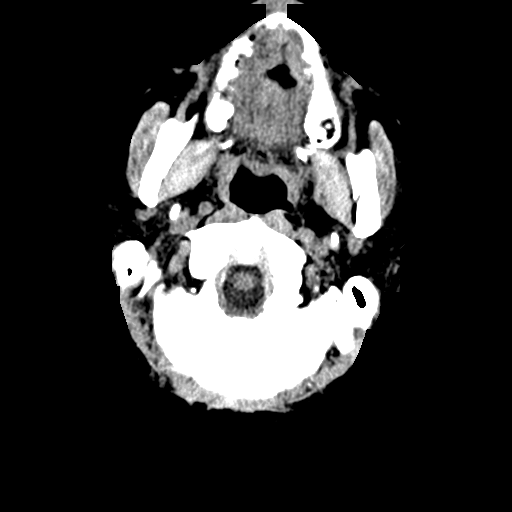
[im 3/33  bone]
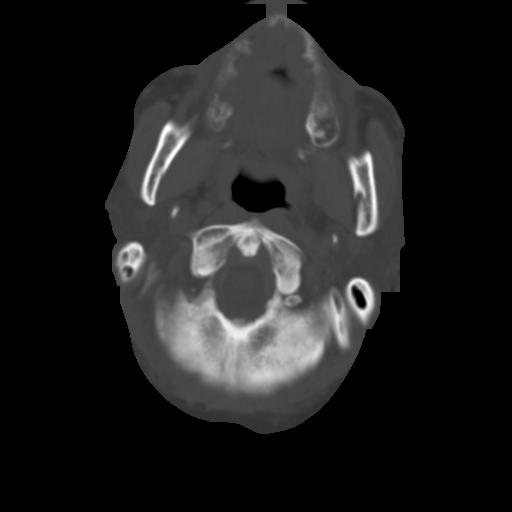
[im 6/33  brain]
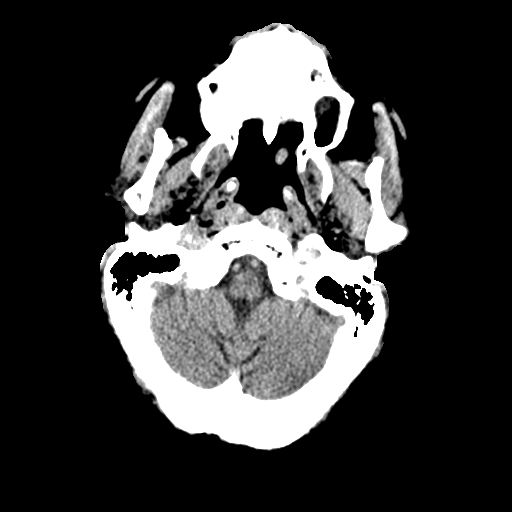
[im 9/33  brain]
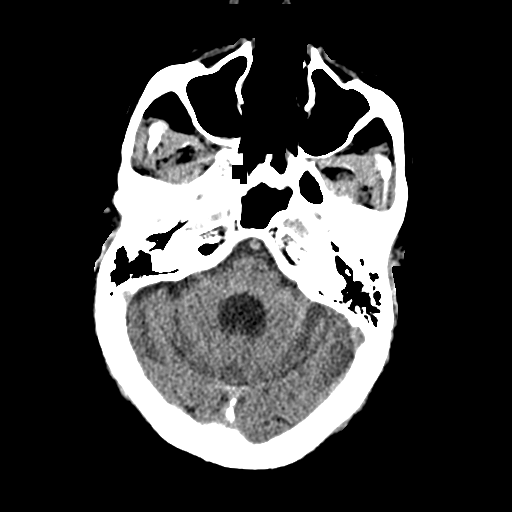
[im 13/33  brain]
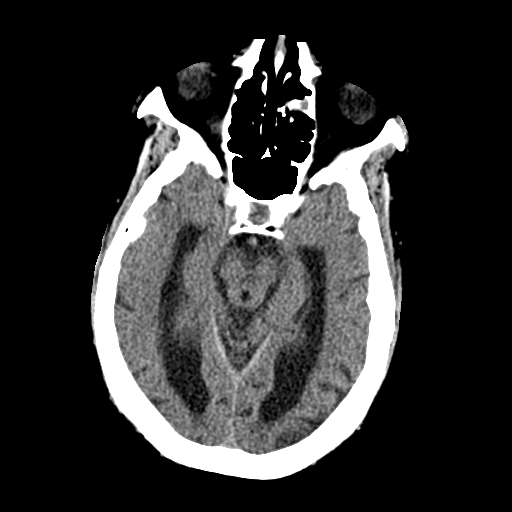
[im 17/33  brain]
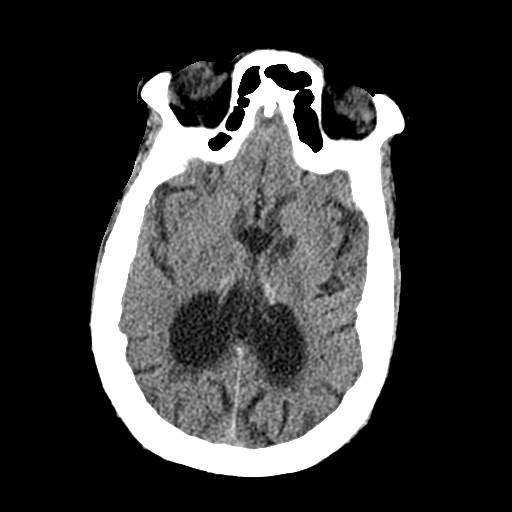
[im 17/33  bone]
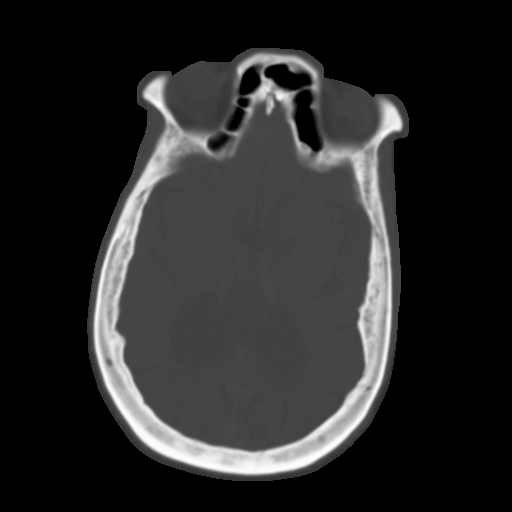
[im 20/33  brain]
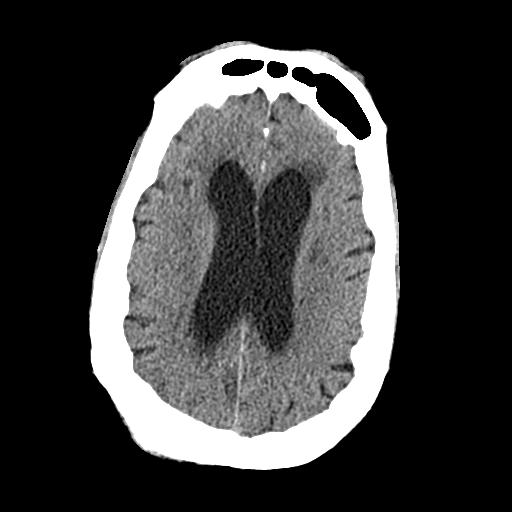
[im 24/33  brain]
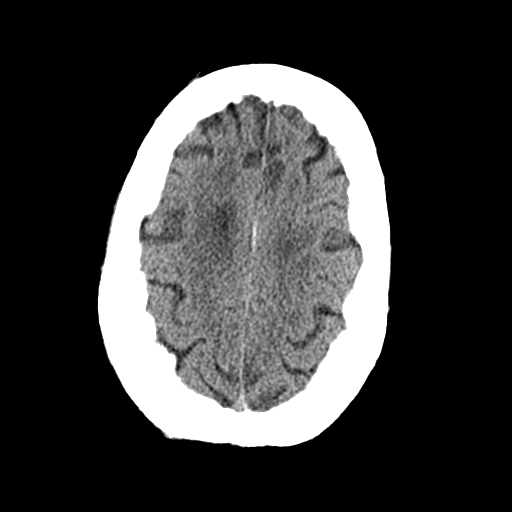
[im 27/33  brain]
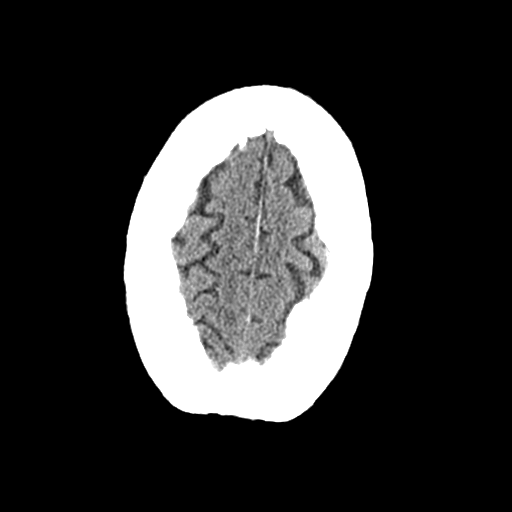
[im 30/33  brain]
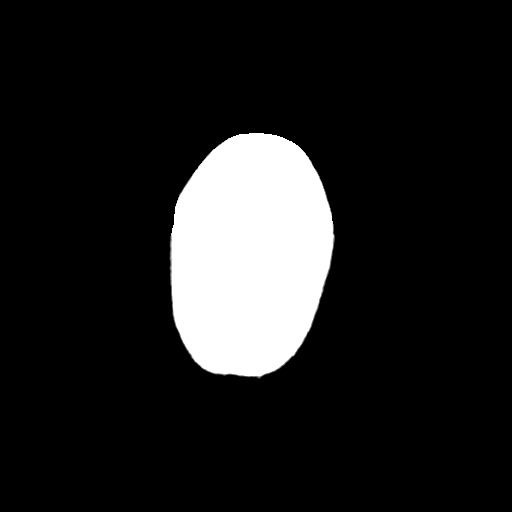
[im 30/33  bone]
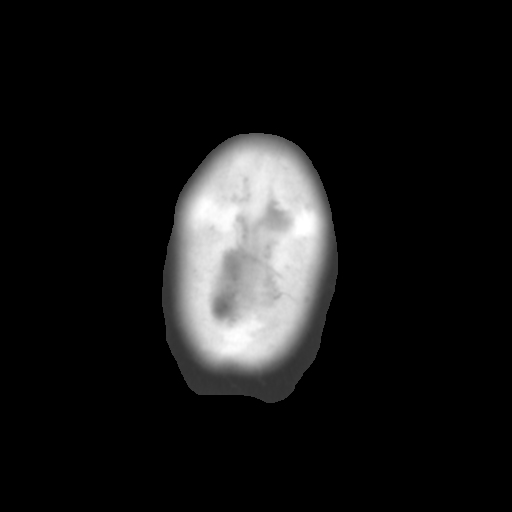

[Series 4: cor soft · coronal · 0.32mm/px · 3 of 72 slices shown]
[im 24/72  brain]
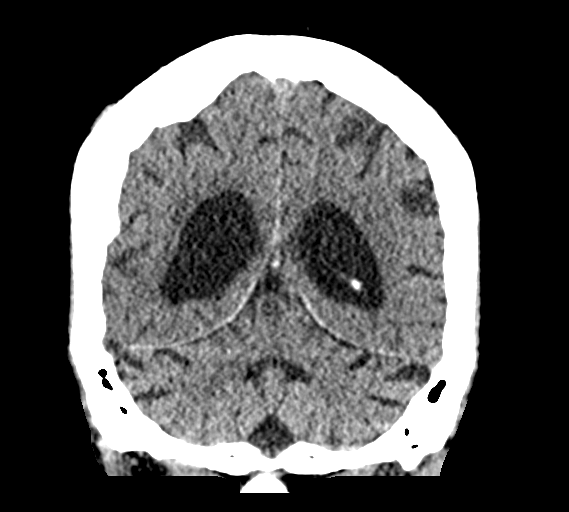
[im 32/72  brain]
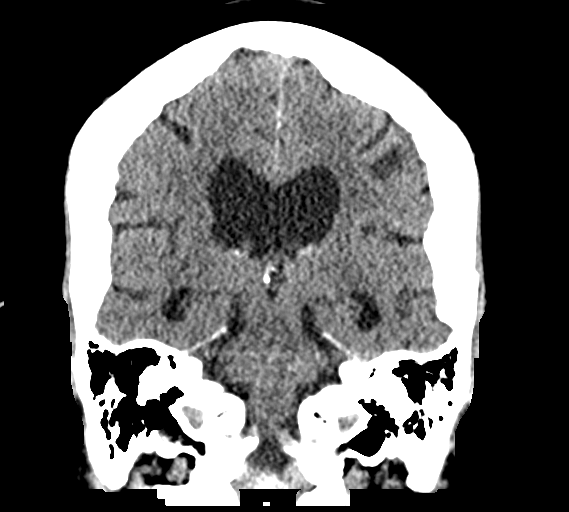
[im 40/72  brain]
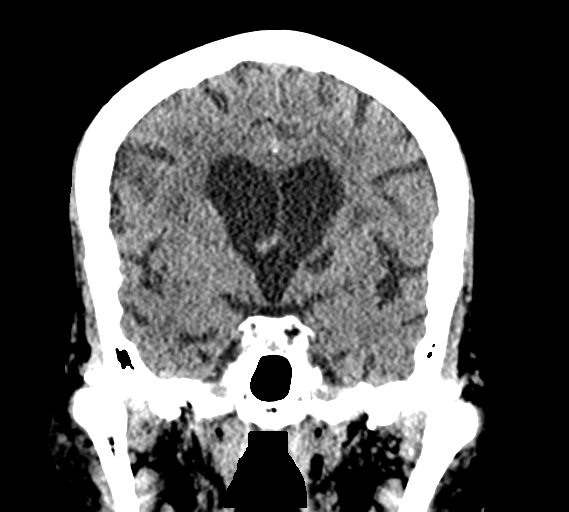

[Series 5: sag soft · sagittal · 0.34mm/px · 3 of 56 slices shown]
[im 19/56  brain]
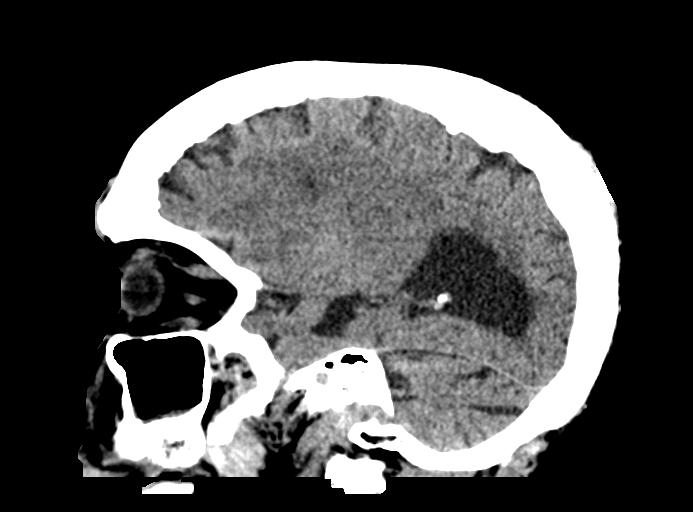
[im 28/56  brain]
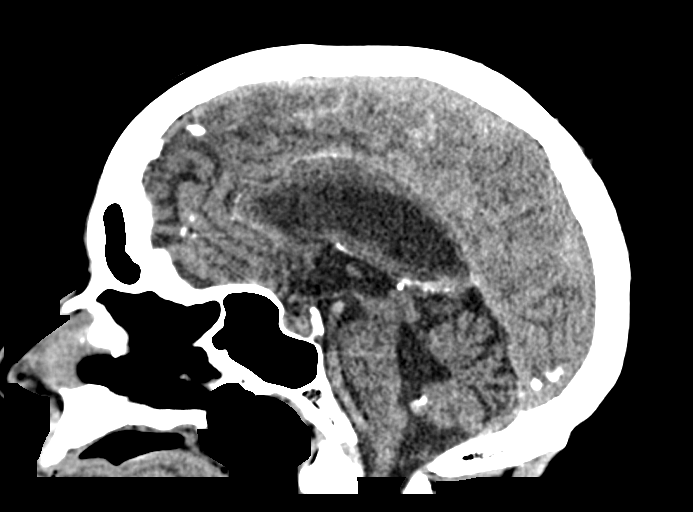
[im 37/56  brain]
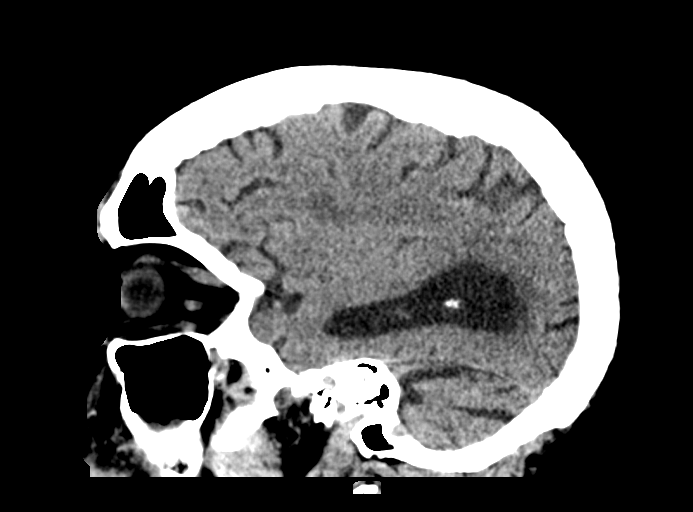

[15 of 47 positions shown; findings below may reference images not displayed]

FINDINGS: Brain: Old left basal ganglia lacunar infarct. There is atrophy and
chronic small vessel disease changes. No acute intracranial
abnormality. Specifically, no hemorrhage, hydrocephalus, mass
lesion, acute infarction, or significant intracranial injury.

Vascular: No hyperdense vessel or unexpected calcification.

Skull: No acute calvarial abnormality.

Sinuses/Orbits: Areas of mucosal thickening.  No air-fluid levels.

Other: None
IMPRESSION: Old left basal ganglia lacunar infarct.

Atrophy, chronic microvascular disease.

No acute intracranial abnormality.

## 2022-10-01 ENCOUNTER — Encounter (INDEPENDENT_AMBULATORY_CARE_PROVIDER_SITE_OTHER): Payer: Self-pay | Admitting: Ophthalmology
